# Patient Record
Sex: Male | Born: 1960
Health system: Southern US, Community
[De-identification: ages and names within clinical notes are randomized; demographics above are authoritative.]

## PROBLEM LIST (undated history)

## (undated) DIAGNOSIS — E785 Hyperlipidemia, unspecified: Secondary | ICD-10-CM

## (undated) DIAGNOSIS — I10 Essential (primary) hypertension: Secondary | ICD-10-CM

## (undated) HISTORY — DX: Hyperlipidemia, unspecified: E78.5

## (undated) HISTORY — DX: Essential (primary) hypertension: I10

---

## 2015-08-14 ENCOUNTER — Ambulatory Visit (INDEPENDENT_AMBULATORY_CARE_PROVIDER_SITE_OTHER): Payer: Self-pay | Admitting: Physician Assistant

## 2015-08-14 VITALS — BP 124/84 | HR 74 | Temp 97.8°F | Resp 16 | Ht 72.0 in | Wt 201.2 lb

## 2015-08-14 DIAGNOSIS — Z024 Encounter for examination for driving license: Secondary | ICD-10-CM

## 2015-08-14 DIAGNOSIS — I10 Essential (primary) hypertension: Secondary | ICD-10-CM

## 2015-08-14 DIAGNOSIS — Z021 Encounter for pre-employment examination: Secondary | ICD-10-CM

## 2015-08-14 NOTE — Progress Notes (Signed)
This patient presents for DOT examination for fitness for duty.  Medical History: yes  Any illness or injury in the last 5 years? - Blood pressure that is [facility] by [facility] no  Head/Brain Injuries, disorders or illnesses no  Seizures, epilepsy no  Eye disorders or impaired vision - wears glasses no  Ear disorders, loss of hearing or balance no  Heart disease or heart attack; other cardiovascular condition no  Heart surgery (valve replacement/bypass, angioplasty, pacemaker) yes  High blood pressure - on medication no  Muscular disease no  Shortness of breath no  Lung disease, emphysema, asthma, chronic bronchitis no  Kidney disease, dialysis no  Liver disease no  Digestive problems no  Diabetes or elevated blood sugar no  Nervious or psychiatric disorders, e.g., severe depression no  Loss of, or altered consciousness no  Fainting, dizziness no  Sleep disorders, pauses in breathing while asleep, daytime sleepiness, loud snoring no  Stroke or paralysis no  Missing or impaired hand, arm, foot, leg, finger, toe no  Spinal injury or disease no  Chronic low back pain no  Regular, frequent alcohol use no  Narcotic or habit forming drug use  Current Medications: Prior to Admission medications   Medication Sig Start Date End Date Taking? Authorizing Provider  lisinopril (PRINIVIL,ZESTRIL) 40 MG tablet Take 40 mg by mouth daily.   Yes Historical Provider, MD    Medical Examiner's Comments on Health History:  Healthy - on blood pressure medications  TESTING:   Visual Acuity Screening   Right eye Left eye Both eyes  Without correction:     With correction: 20/20 20/20 20/20   Comments: Colors:8/8 Titimus: 85 pass The patient was able to hear a forced whisper from L=10, R=10 feet.    Monocular Vision: No.  Hearing Aid used for test: No. Hearing Aid required to to meet standard: No.  BP 142/80 mmHg  Pulse 74  Temp(Src) 97.8 F (36.6 C) (Oral)  Resp 16   Ht 6' (1.829 m)  Wt 201 lb 3.2 oz (91.264 kg)  BMI 27.28 kg/m2  SpO2 98% Pulse rate is regular  Urine Specimen: Specific Gravity 1.010, Protein neg, Blood neg, Sugar neg  PHYSICAL EXAMINATION:  1. No. General Appearance: Marked overweight, tremor, signs of alcoholism, problem drinking or drug abuse. 2. No. Eyes: pupillary equality, reaction to light, accommodation, ocular motility, ocular muscle imbalance, extra ocular movement, nystagmus, exopthalmos. Ask about retinopathy, cataracts, aphakia, glaucoma, macular degeneration and refer to a specialist if appropriate.  3. No. Ears: Scarring of tympanic membrane, occlusion of external canal, perforated eardrums.     4. No. Mouth and Throat: Irremedial deformities likely to interfere with breathing or swallowing.    5. No. Heart: Murmurs, extra sounds, enlarged heart, pacemaker, implantable defibrillator.     6. No. Lungs and Chest, not including breast examination: Abnormal Chest wall expansion, abnormal respiratory rate, abnormal breath sounds including wheezes or alveolar rates, impaired respiratory function, cyanosis. Abnormal findings on physical exam may require further testing such as pulmonary tests and/or x ray of chest.  7. No. Abdomen and Viscera: Enlarged liver, enlarged spleen, masses, bruits, hernia, significant abdominal wall muscle weakness.  8. No. Vascular System: Abnormal pulse and amplitude, carotid or arterial bruits, varicose veins.    9. No. Genitourinary System: Hernia  10. No. Extremities-Limb impaired: Loss or impairment of leg, foot, toe, arm, hand, finger. Perceptible limp, deformities, atrophy, weakness, paralysis, clubbing, edema, hypotonia. Insufficient grasp and prehension to maintain steering wheel grip. Insufficient  mobility and strength in lower limb to operate pedals properly. 11. No. Spine, other musculoskeletal: Previous surgery, deformities, limitation of motion, tenderness.  12. No. Neurological: Impaired  equilibrium, coordination or speech pattern; paresthesia, asymmetric deep tendon reflexes, sensory or positional abnormalities, abnormal patellar and Babinski's reflexes, ataxia.     Comments: healthy - HTN well controlled on medication  Certification Status: does not meet standards for 2 year certificate. Does not meet standards. 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

## 2016-05-25 ENCOUNTER — Ambulatory Visit (INDEPENDENT_AMBULATORY_CARE_PROVIDER_SITE_OTHER): Payer: Self-pay | Admitting: Adult Health

## 2016-05-25 VITALS — BP 186/90 | HR 95 | Resp 20 | Ht 72.5 in | Wt 206.6 lb

## 2016-05-25 DIAGNOSIS — Z0000 Encounter for general adult medical examination without abnormal findings: Secondary | ICD-10-CM

## 2016-05-25 DIAGNOSIS — I10 Essential (primary) hypertension: Secondary | ICD-10-CM

## 2016-05-25 DIAGNOSIS — Z1211 Encounter for screening for malignant neoplasm of colon: Secondary | ICD-10-CM

## 2016-05-25 LAB — DIFFERENTIAL
BASOS PCT: 1 %
Basophils Absolute: 79 cells/uL (ref 0–200)
EOS PCT: 3 %
Eosinophils Absolute: 237 cells/uL (ref 15–500)
Lymphocytes Relative: 23 %
Lymphs Abs: 1817 cells/uL (ref 850–3900)
MONOS PCT: 10 %
Monocytes Absolute: 790 cells/uL (ref 200–950)
Neutro Abs: 4977 cells/uL (ref 1500–7800)
Neutrophils Relative %: 63 %

## 2016-05-25 LAB — CBC
HCT: 48.9 % (ref 38.5–50.0)
Hemoglobin: 16 g/dL (ref 13.2–17.1)
MCH: 29.1 pg (ref 27.0–33.0)
MCHC: 32.7 g/dL (ref 32.0–36.0)
MCV: 88.9 fL (ref 80.0–100.0)
MPV: 10.2 fL (ref 7.5–12.5)
PLATELETS: 227 10*3/uL (ref 140–400)
RBC: 5.5 MIL/uL (ref 4.20–5.80)
RDW: 14.5 % (ref 11.0–15.0)
WBC: 7.9 10*3/uL (ref 3.8–10.8)

## 2016-05-25 LAB — CBC WITH DIFFERENTIAL/PLATELET
BASOS ABS: 79 {cells}/uL (ref 0–200)
Basophils Relative: 1 %
EOS ABS: 237 {cells}/uL (ref 15–500)
Eosinophils Relative: 3 %
HCT: 48.9 % (ref 38.5–50.0)
Hemoglobin: 16 g/dL (ref 13.2–17.1)
Lymphocytes Relative: 23 %
Lymphs Abs: 1817 cells/uL (ref 850–3900)
MCH: 29.1 pg (ref 27.0–33.0)
MCHC: 32.7 g/dL (ref 32.0–36.0)
MCV: 88.9 fL (ref 80.0–100.0)
MONOS PCT: 10 %
MPV: 10.2 fL (ref 7.5–12.5)
Monocytes Absolute: 790 cells/uL (ref 200–950)
NEUTROS ABS: 4977 {cells}/uL (ref 1500–7800)
NEUTROS PCT: 63 %
PLATELETS: 227 10*3/uL (ref 140–400)
RBC: 5.5 MIL/uL (ref 4.20–5.80)
RDW: 14.5 % (ref 11.0–15.0)
WBC: 7.9 10*3/uL (ref 3.8–10.8)

## 2016-05-25 LAB — LIPID PANEL
CHOLESTEROL: 241 mg/dL — AB (ref <200)
HDL: 32 mg/dL — ABNORMAL LOW (ref >40)
LDL CALC: 140 mg/dL — AB (ref <100)
Total CHOL/HDL Ratio: 7.5 Ratio — ABNORMAL HIGH (ref <5.0)
Triglycerides: 347 mg/dL — ABNORMAL HIGH (ref <150)
VLDL: 69 mg/dL — AB (ref <30)

## 2016-05-25 LAB — HEPATIC FUNCTION PANEL
ALBUMIN: 4.3 g/dL (ref 3.6–5.1)
ALK PHOS: 54 U/L (ref 40–115)
ALT: 22 U/L (ref 9–46)
AST: 21 U/L (ref 10–35)
Bilirubin, Direct: 0.1 mg/dL (ref <=0.2)
Indirect Bilirubin: 0.3 mg/dL (ref 0.2–1.2)
TOTAL PROTEIN: 7.2 g/dL (ref 6.1–8.1)
Total Bilirubin: 0.4 mg/dL (ref 0.2–1.2)

## 2016-05-25 LAB — BASIC METABOLIC PANEL
BUN: 10 mg/dL (ref 7–25)
CHLORIDE: 103 mmol/L (ref 98–110)
CO2: 31 mmol/L (ref 20–31)
CREATININE: 1 mg/dL (ref 0.70–1.33)
Calcium: 9.6 mg/dL (ref 8.6–10.3)
GLUCOSE: 92 mg/dL (ref 65–99)
Potassium: 4.7 mmol/L (ref 3.5–5.3)
Sodium: 142 mmol/L (ref 135–146)

## 2016-05-25 LAB — PSA: PSA: 0.5 ng/mL (ref <=4.0)

## 2016-05-25 MED ORDER — LISINOPRIL 40 MG PO TABS: 40.0000 mg | ORAL_TABLET | Freq: Every day | ORAL | 0 refills | Status: DC

## 2016-05-25 NOTE — Patient Instructions (Signed)
It was a pleasure meeting you today!  We will follow up with you on your labs.   I have sent in a 90 day supply of lisinopril.   Someone will call you to schedule your appointment for a colonoscopy.   Please follow up with a primary care provider to manage your elevated blood pressure.   Health Maintenance, Male A healthy lifestyle and preventative care can promote health and wellness.  Maintain regular health, dental, and eye exams.  Eat a healthy diet. Foods like vegetables, fruits, whole grains, low-fat dairy products, and lean protein foods contain the nutrients you need and are low in calories. Decrease your intake of foods high in solid fats, added sugars, and salt. Get information about a proper diet from your health care provider, if necessary.  Regular physical exercise is one of the most important things you can do for your health. Most adults should get at least 150 minutes of moderate-intensity exercise (any activity that increases your heart rate and causes you to sweat) each week. In addition, most adults need muscle-strengthening exercises on 2 or more days a week.   Maintain a healthy weight. The body mass index (BMI) is a screening tool to identify possible weight problems. It provides an estimate of body fat based on height and weight. Your health care provider can find your BMI and can help you achieve or maintain a healthy weight. For males 20 years and older:  A BMI below 18.5 is considered underweight.  A BMI of 18.5 to 24.9 is normal.  A BMI of 25 to 29.9 is considered overweight.  A BMI of 30 and above is considered obese.  Maintain normal blood lipids and cholesterol by exercising and minimizing your intake of saturated fat. Eat a balanced diet with plenty of fruits and vegetables. Blood tests for lipids and cholesterol should begin at age 54 and be repeated every 5 years. If your lipid or cholesterol levels are high, you are over age 54, or you are at high risk  for heart disease, you may need your cholesterol levels checked more frequently.Ongoing high lipid and cholesterol levels should be treated with medicines if diet and exercise are not working.  If you smoke, find out from your health care provider how to quit. If you do not use tobacco, do not start.  Lung cancer screening is recommended for adults aged 55-80 years who are at high risk for developing lung cancer because of a history of smoking. A yearly low-dose CT scan of the lungs is recommended for people who have at least a 30-pack-year history of smoking and are current smokers or have quit within the past 15 years. A pack year of smoking is smoking an average of 1 pack of cigarettes a day for 1 year (for example, a 30-pack-year history of smoking could mean smoking 1 pack a day for 30 years or 2 packs a day for 15 years). Yearly screening should continue until the smoker has stopped smoking for at least 15 years. Yearly screening should be stopped for people who develop a health problem that would prevent them from having lung cancer treatment.  If you choose to drink alcohol, do not have more than 2 drinks per day. One drink is considered to be 12 oz (360 mL) of beer, 5 oz (150 mL) of wine, or 1.5 oz (45 mL) of liquor.  Avoid the use of street drugs. Do not share needles with anyone. Ask for help if you need support  or instructions about stopping the use of drugs.  High blood pressure causes heart disease and increases the risk of stroke. High blood pressure is more likely to develop in:  People who have blood pressure in the end of the normal range (100-139/85-89 mm Hg).  People who are overweight or obese.  People who are African American.  If you are 3-69 years of age, have your blood pressure checked every 3-5 years. If you are 41 years of age or older, have your blood pressure checked every year. You should have your blood pressure measured twice--once when you are at a hospital or  clinic, and once when you are not at a hospital or clinic. Record the average of the two measurements. To check your blood pressure when you are not at a hospital or clinic, you can use:  An automated blood pressure machine at a pharmacy.  A home blood pressure monitor.  If you are 70-57 years old, ask your health care provider if you should take aspirin to prevent heart disease.  Diabetes screening involves taking a blood sample to check your fasting blood sugar level. This should be done once every 3 years after age 25 if you are at a normal weight and without risk factors for diabetes. Testing should be considered at a younger age or be carried out more frequently if you are overweight and have at least 1 risk factor for diabetes.  Colorectal cancer can be detected and often prevented. Most routine colorectal cancer screening begins at the age of 47 and continues through age 60. However, your health care provider may recommend screening at an earlier age if you have risk factors for colon cancer. On a yearly basis, your health care provider may provide home test kits to check for hidden blood in the stool. A small camera at the end of a tube may be used to directly examine the colon (sigmoidoscopy or colonoscopy) to detect the earliest forms of colorectal cancer. Talk to your health care provider about this at age 29 when routine screening begins. A direct exam of the colon should be repeated every 5-10 years through age 33, unless early forms of precancerous polyps or small growths are found.  People who are at an increased risk for hepatitis B should be screened for this virus. You are considered at high risk for hepatitis B if:  You were born in a country where hepatitis B occurs often. Talk with your health care provider about which countries are considered high risk.  Your parents were born in a high-risk country and you have not received a shot to protect against hepatitis B (hepatitis B  vaccine).  You have HIV or AIDS.  You use needles to inject street drugs.  You live with, or have sex with, someone who has hepatitis B.  You are a man who has sex with other men (MSM).  You get hemodialysis treatment.  You take certain medicines for conditions like cancer, organ transplantation, and autoimmune conditions.  Hepatitis C blood testing is recommended for all people born from [birth year] through 1964 and any individual with known risk factors for hepatitis C.  Healthy men should no longer receive prostate-specific antigen (PSA) blood tests as part of routine cancer screening. Talk to your health care provider about prostate cancer screening.  Testicular cancer screening is not recommended for adolescents or adult males who have no symptoms. Screening includes self-exam, a health care provider exam, and other screening tests. Consult with your health  care provider about any symptoms you have or any concerns you have about testicular cancer.  Practice safe sex. Use condoms and avoid high-risk sexual practices to reduce the spread of sexually transmitted infections (STIs).  You should be screened for STIs, including gonorrhea and chlamydia if:  You are sexually active and are younger than 24 years.  You are older than 24 years, and your health care provider tells you that you are at risk for this type of infection.  Your sexual activity has changed since you were last screened, and you are at an increased risk for chlamydia or gonorrhea. Ask your health care provider if you are at risk.  If you are at risk of being infected with HIV, it is recommended that you take a prescription medicine daily to prevent HIV infection. This is called pre-exposure prophylaxis (PrEP). You are considered at risk if:  You are a man who has sex with other men (MSM).  You are a heterosexual man who is sexually active with multiple partners.  You take drugs by injection.  You are sexually active  with a partner who has HIV.  Talk with your health care provider about whether you are at high risk of being infected with HIV. If you choose to begin PrEP, you should first be tested for HIV. You should then be tested every 3 months for as long as you are taking PrEP.  Use sunscreen. Apply sunscreen liberally and repeatedly throughout the day. You should seek shade when your shadow is shorter than you. Protect yourself by wearing long sleeves, pants, a wide-brimmed hat, and sunglasses year round whenever you are outdoors.  Tell your health care provider of new moles or changes in moles, especially if there is a change in shape or color. Also, tell your health care provider if a mole is larger than the size of a pencil eraser.  A one-time screening for abdominal aortic aneurysm (AAA) and surgical repair of large AAAs by ultrasound is recommended for men aged 88-75 years who are current or former smokers.  Stay current with your vaccines (immunizations).   This information is not intended to replace advice given to you by your health care provider. Make sure you discuss any questions you have with your health care provider.   Document Released: 11/21/2007 Document Revised: 06/15/2014 Document Reviewed: 10/20/2010 Elsevier Interactive Patient Education [employer].

## 2016-05-25 NOTE — Progress Notes (Signed)
Subjective:    Patient ID: David Maddox, male    DOB: [birth year], 55 y.o.   MRN: [id number]  HPI  [facility] preventative medicine examination. He is a pleasant 55 year old male who  has a past medical history of Essential hypertension.  All immunizations and health maintenance protocols were reviewed with the patient and needed orders were placed. He refuses influenza or tdap   Appropriate screening laboratory values were ordered for the patient including screening of hyperlipidemia, renal function and hepatic function. If indicated by BPH, a PSA was ordered.  Medication reconciliation,  past medical history, social history, problem list and allergies were reviewed in detail with the patient  Goals were established with regard to weight loss, exercise, and  diet in compliance with medications.  He has a history of hypertension and reports being on Lisinopril 40 mg. 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 He reports having " a few pills" but that he has not taken his blood pressure medication in the last " few weeks."     Review of Systems  Constitutional: Negative.   HENT: Negative.   Eyes: Negative.   Respiratory: Negative.   Cardiovascular: Negative.   Gastrointestinal: Negative.   Endocrine: Negative.   Genitourinary: Negative.   Musculoskeletal: Negative.   Skin: Negative.   Allergic/Immunologic: Negative.   Neurological: Negative.   Hematological: Negative.   Psychiatric/Behavioral: Negative.   All other systems reviewed and are negative.  Past Medical History:  Diagnosis Date  . Essential hypertension     Social History   Social History  . Marital status: Single    Spouse name: N/A  . Number of children: N/A  . Years of education: N/A   Occupational History  . Not on file.   Social History Main Topics  . Smoking status: Never Smoker  . Smokeless tobacco: Not on  file  . Alcohol use Not on file  . Drug use: Unknown  . Sexual activity: Not on file   Other Topics Concern  . Not on file   Social History Narrative  . No narrative on file    No past surgical history on file.  No family history on file.  No Known Allergies  No current outpatient prescriptions on file prior to visit.   No current facility-administered medications on file prior to visit.     BP (!) 186/90   Pulse 95   Resp 20   Ht 6' 0.5" (1.842 m)   Wt 206 lb 9.6 oz (93.7 kg)   BMI 27.63 kg/m       Objective:   Physical Exam  Constitutional: He is oriented to person, place, and time. He appears well-developed and well-nourished. No distress.  Slightly obese   HENT:  Head: Normocephalic and atraumatic.  Right Ear: External ear normal.  Left Ear: External ear normal.  Nose: Nose normal.  Mouth/Throat: Oropharynx is clear and moist. No oropharyngeal exudate.  Eyes: Conjunctivae and EOM are normal. Pupils are equal, round, and reactive to light. Right eye exhibits no discharge. Left eye exhibits no discharge. No scleral icterus.  Neck: Normal range of motion. Neck supple. No JVD present. No tracheal deviation present. No thyromegaly present.  Cardiovascular: Normal rate, regular rhythm, normal heart sounds and intact distal pulses.  Exam reveals no gallop and no friction rub.   No murmur heard. Pulmonary/Chest: Effort normal and breath sounds normal. No stridor. No respiratory  distress. He has no wheezes. He has no rales. He exhibits no tenderness.  Abdominal: Soft. Bowel sounds are normal. He exhibits no distension and no mass. There is no tenderness. There is no rebound and no guarding.  Genitourinary: Prostate normal. Rectal exam shows guaiac positive stool.  Musculoskeletal: Normal range of motion. He exhibits no edema, tenderness or deformity.  Lymphadenopathy:    He has no cervical adenopathy.  Neurological: He is alert and oriented to person, place, and time.  He has normal reflexes. He displays normal reflexes. No cranial nerve deficit. He exhibits normal muscle tone. Coordination normal.  Skin: Skin is warm and dry. No rash noted. He is not diaphoretic. No erythema. No pallor.  Scattered moles noted.  Has surgical scar on back from previous surgery  Psychiatric: He has a normal mood and affect. His behavior is normal. Judgment and thought content normal.  Nursing note and vitals reviewed.      Assessment & Plan:  1. Routine general medical examination at a health care facility - Educated on the importance of diet and exercise - Take blood pressure medication daily.  - Monitor BP at home.  - Basic metabolic panel; Future - CBC with Differential/Platelet; Future - Hepatic function panel; Future - Hemoglobin A1c; Future - Lipid panel; Future - POCT Urinalysis Dipstick (Automated); Future - PSA; Future - TSH  2. Colon cancer screening - Ambulatory referral to Gastroenterology  3. Essential hypertension - Educated on the importance of a heart healthy diet and frequent exercise - He is to take his blood pressure medication when he gets home - lisinopril (PRINIVIL,ZESTRIL) 40 MG tablet; Take 1 tablet (40 mg total) by mouth daily.  Dispense: 90 tablet; Refill: 0 - Scheduled establish care visit with Dr. Swazil[place]Jordan to follow up with on hypertension.   Shirline Freesory Kaevon Cotta, NP

## 2016-05-25 NOTE — Addendum Note (Signed)
Addended by: Kern ReapVEREEN, Dene Landsberg B on: 05/25/2016 03:03 PM   Modules accepted: Orders

## 2016-05-26 LAB — HEMOGLOBIN A1C
Hgb A1c MFr Bld: 5.7 % — ABNORMAL HIGH (ref <5.7)
Mean Plasma Glucose: 117 mg/dL

## 2016-05-26 LAB — TSH: TSH: 1.02 m[IU]/L (ref 0.40–4.50)

## 2016-06-04 ENCOUNTER — Encounter: Payer: Self-pay | Admitting: *Deleted

## 2016-06-11 ENCOUNTER — Encounter: Payer: Self-pay | Admitting: Family Medicine

## 2016-06-11 ENCOUNTER — Ambulatory Visit (INDEPENDENT_AMBULATORY_CARE_PROVIDER_SITE_OTHER): Payer: Commercial Managed Care - PPO | Admitting: Family Medicine

## 2016-06-11 VITALS — BP 142/80 | HR 76 | Resp 12 | Ht 72.5 in | Wt 210.1 lb

## 2016-06-11 DIAGNOSIS — E8881 Metabolic syndrome: Secondary | ICD-10-CM | POA: Diagnosis not present

## 2016-06-11 DIAGNOSIS — E782 Mixed hyperlipidemia: Secondary | ICD-10-CM

## 2016-06-11 DIAGNOSIS — I10 Essential (primary) hypertension: Secondary | ICD-10-CM

## 2016-06-11 DIAGNOSIS — Z0000 Encounter for general adult medical examination without abnormal findings: Secondary | ICD-10-CM

## 2016-06-11 NOTE — Progress Notes (Signed)
Pre visit review using our clinic review tool, if applicable. No additional management support is needed unless otherwise documented below in the visit note. 

## 2016-06-11 NOTE — Progress Notes (Signed)
David Maddox is a 56 y.o.male, who is here today to establish care with me and to follow on HTN. Last CPE 4 years ago, he had a DOT physical a few months ago.  Dx with HTN years ago.  Currently he is on Lisinopril 40 mg daily.  He is taking medications as instructed, no side effects reported.  He has not noted unusual headache, visual changes, exertional chest pain, dyspnea,  focal weakness, or edema.    Lab Results  Component Value Date   CREATININE 1.00 05/25/2016   BUN 10 05/25/2016   NA 142 05/25/2016   K 4.7 05/25/2016   CL 103 05/25/2016   CO2 31 05/25/2016    He is not exercise regularly, has not been consistent with a healthy diet but he is following low salt diet.  He is not checking BP at home.   Hyperlipidemia:   He has not been consistent with low fat diet.  Last lipid panel was not fasting.  Lab Results  Component Value Date   CHOL 241 (H) 05/25/2016   HDL 32 (L) 05/25/2016   LDLCALC 140 (H) 05/25/2016   TRIG 347 (H) 05/25/2016   CHOLHDL 7.5 (H) 05/25/2016      Review of Systems  Constitutional: Negative for activity change, appetite change, fatigue, fever and unexpected weight change.  HENT: Negative for nosebleeds, sore throat and trouble swallowing.   Eyes: Negative for redness and visual disturbance.  Respiratory: Negative for apnea, cough, shortness of breath and wheezing.   Cardiovascular: Negative for chest pain, palpitations and leg swelling.  Gastrointestinal: Negative for abdominal pain, nausea and vomiting.       No changes in bowel habits.  Genitourinary: Negative for decreased urine volume, difficulty urinating and hematuria.  Skin: Negative for rash.  Neurological: Negative for syncope, weakness and headaches.  Psychiatric/Behavioral: Negative for confusion. The patient is not nervous/anxious.      No current outpatient prescriptions on file prior to visit.   No current facility-administered medications on  file prior to visit.      Past Medical History:  Diagnosis Date  . Essential hypertension   . Hyperlipidemia     No Known Allergies  Social History   Social History  . Marital status: Single    Spouse name: N/A  . Number of children: N/A  . Years of education: N/A   Social History Main Topics  . Smoking status: Never Smoker  . Smokeless tobacco: Never Used  . Alcohol use None  . Drug use: No  . Sexual activity: Not Asked   Other Topics Concern  . None   Social History Narrative  . None    Vitals:   06/11/16 1455  BP: (!) 142/80  Pulse: 76  Resp: 12   Body mass index is 28.11 kg/m.    Physical Exam  Nursing note and vitals reviewed. Constitutional: He is oriented to person, place, and time. He appears well-developed. No distress.  HENT:  Head: Atraumatic.  Mouth/Throat: Oropharynx is clear and moist and mucous membranes are normal.  Eyes: Conjunctivae and EOM are normal. Pupils are equal, round, and reactive to light.  Neck: No thyroid mass and no thyromegaly present.  Cardiovascular: Normal rate and regular rhythm.   No murmur heard. Pulses:      Posterior tibial pulses are 2+ on the right side, and 2+ on the left side.  Respiratory: Effort normal and breath sounds normal. No respiratory distress.  GI: Soft. He exhibits  no mass. There is no hepatomegaly. There is no tenderness.  Musculoskeletal: He exhibits edema (1+ pitting LE edema bilateral).  Neurological: He is alert and oriented to person, place, and time. He has normal strength.  Skin: Skin is warm. No erythema.  Psychiatric: He has a normal mood and affect. Cognition and memory are normal.  Well groomed, good eye contact.    ASSESSMENT AND PLAN:   [name] was seen today for establish care.  Diagnoses and all orders for this visit:   Essential hypertension   SBP mildly elevated. No changes in current management for now, recommended monitoring BP at home. DASH-low salt  diet  recommended. Periodic eye exam recommended, at least every 1-2 years.  F/U in 4 months, before if needed.  -     lisinopril (PRINIVIL,ZESTRIL) 40 MG tablet; Take 1 tablet (40 mg total) by mouth daily.  Mixed hyperlipidemia  Low fat diet discussed, continue non pharmacologic treatment for now. F/U in 4 months.  Metabolic syndrome  TG 347, HgA1C 5.7, abdominal obesity and BMI > 25, HDL 32. We discussed risk for CVD and prevention through a healthy life style.  Healthcare maintenance  He has not had colonoscopy, for now he is not interested because work schedule.    -David Maddox was advised to return sooner than planned today if new concerns arise.     Iman Reinertsen G. [place], MD  [facility]. [place] office.

## 2016-06-11 NOTE — Patient Instructions (Addendum)
A few things to remember from today's visit:   Benign essential HTN  Mixed hyperlipidemia  Metabolic syndrome    Blood pressure goal for most people is less than 140/90.  Elevated blood pressure increases the risk of strokes, heart and kidney disease, and eye problems.  Regular physical activity and a healthy diet (DASH diet) usually help. Low salt diet. Take medications as instructed. Caution with some over the counter medications as cold medications, dietary products (for weight loss), and Ibuprofen or Aleve (frequent use);all these medications could cause elevation of blood pressure.    Please be sure medication list is accurate. If a new problem present, please set up appointment sooner than planned today.

## 2016-06-14 MED ORDER — LISINOPRIL 40 MG PO TABS: 40.0000 mg | ORAL_TABLET | Freq: Every day | ORAL | 0 refills | Status: DC

## 2016-08-05 ENCOUNTER — Encounter: Payer: Self-pay | Admitting: Adult Health

## 2016-10-01 DIAGNOSIS — R233 Spontaneous ecchymoses: Secondary | ICD-10-CM | POA: Diagnosis not present

## 2016-10-14 NOTE — Progress Notes (Deleted)
     HPI:  [name] is a 56 y.o.male here today for his routine physical examination.  He lives with ***  Regular exercise 3 or more times per week: *** Following a healthy diet: ***   Chronic medical problems: ***  Hx of STD's: ***   There is no immunization history on file for this patient.    -Hep C screening (if born [id number]): ***   Last colon cancer screening: *** Last prostate ca screening: ***  -Denies high alcohol intake, tobacco use, or Hx of illicit drug use.  -Concerns and/or follow up today: ***   HTN:  He is currently on Lisinopril 40 mg daily. Home BP's: ***  Denies severe/frequent headache, visual changes, chest pain, dyspnea, palpitation, claudication, focal weakness, or edema.  Lab Results  Component Value Date   CREATININE 1.00 05/25/2016   BUN 10 05/25/2016   NA 142 05/25/2016   K 4.7 05/25/2016   CL 103 05/25/2016   CO2 31 05/25/2016     Hyperlipidemia:  Currently on ***   Lab Results  Component Value Date   CHOL 241 (H) 05/25/2016   HDL 32 (L) 05/25/2016   LDLCALC 140 (H) 05/25/2016   TRIG 347 (H) 05/25/2016   CHOLHDL 7.5 (H) 05/25/2016    IFG:  Lab Results  Component Value Date   HGBA1C 5.7 (H) 05/25/2016   He is *** following a healthy diet consistently. He is *** exercising regularly.   Review of Systems   Current Outpatient Prescriptions on File Prior to Visit  Medication Sig Dispense Refill  . lisinopril (PRINIVIL,ZESTRIL) 40 MG tablet Take 1 tablet (40 mg total) by mouth daily. 90 tablet 0   No current facility-administered medications on file prior to visit.      Past Medical History:  Diagnosis Date  . Essential hypertension   . Hyperlipidemia     No Known Allergies  Family History  Problem Relation Age of Onset  . Hyperlipidemia Mother   . Diabetes Mother   . Hypertension Mother   . Hypertension Father   . Hyperlipidemia Father   . Diabetes Brother   . Diabetes  Maternal Grandmother   . Diabetes Maternal Grandfather     Social History   Social History  . Marital status: Single    Spouse name: N/A  . Number of children: N/A  . Years of education: N/A   Social History Main Topics  . Smoking status: Never Smoker  . Smokeless tobacco: Never Used  . Alcohol use Not on file  . Drug use: No  . Sexual activity: Not on file   Other Topics Concern  . Not on file   Social History Narrative  . No narrative on file     There were no vitals filed for this visit. There is no height or weight on file to calculate BMI.  @LASTSAO2 (3)@  Wt Readings from Last 3 Encounters:  06/11/16 210 lb 2 oz (95.3 kg)  05/25/16 206 lb 9.6 oz (93.7 kg)  08/14/15 201 lb 3.2 oz (91.3 kg)        Physical Exam      ASSESSMENT AND PLAN:   Discussed the following assessment and plan:   There are no diagnoses linked to this encounter.       No Follow-up on file.    Betty G. SwazilandJordan, MD  [facility]. [place] office.

## 2016-10-15 ENCOUNTER — Encounter: Payer: Commercial Managed Care - PPO | Admitting: Family Medicine

## 2017-04-17 DIAGNOSIS — I10 Essential (primary) hypertension: Secondary | ICD-10-CM | POA: Diagnosis not present

## 2017-04-17 DIAGNOSIS — L03119 Cellulitis of unspecified part of limb: Secondary | ICD-10-CM | POA: Diagnosis not present

## 2017-08-19 DIAGNOSIS — J111 Influenza due to unidentified influenza virus with other respiratory manifestations: Secondary | ICD-10-CM | POA: Diagnosis not present

## 2018-08-14 ENCOUNTER — Encounter (HOSPITAL_COMMUNITY): Payer: Self-pay | Admitting: *Deleted

## 2018-08-14 ENCOUNTER — Ambulatory Visit (HOSPITAL_COMMUNITY)
Admission: EM | Admit: 2018-08-14 | Discharge: 2018-08-14 | Disposition: A | Discharge status: Home / Self Care | Payer: Commercial Managed Care - PPO | Attending: Family Medicine | Admitting: Family Medicine

## 2018-08-14 ENCOUNTER — Other Ambulatory Visit: Payer: Self-pay

## 2018-08-14 DIAGNOSIS — I10 Essential (primary) hypertension: Secondary | ICD-10-CM

## 2018-08-14 DIAGNOSIS — R42 Dizziness and giddiness: Secondary | ICD-10-CM | POA: Diagnosis not present

## 2018-08-14 DIAGNOSIS — R202 Paresthesia of skin: Secondary | ICD-10-CM

## 2018-08-14 MED ORDER — AMLODIPINE BESYLATE 5 MG PO TABS: 5.0000 mg | ORAL_TABLET | Freq: Every day | ORAL | 5 refills | Status: DC

## 2018-08-14 NOTE — Discharge Instructions (Addendum)
The dizziness may be related to the lisinopril.  In any event I think your better off taking the amlodipine because it has fewer side effects potentially.  The EKG we did today shows no damage to the heart or abnormality.  Your exam is normal as well.  At this point it be important to drink plenty of fluids as you are doing.  I think your weight loss over the last several months has been helpful for controlling her blood pressure.`

## 2018-08-14 NOTE — ED Provider Notes (Signed)
[facility]    CSN: [id number] Arrival date & time: 08/14/18  1506     History   Chief Complaint Chief Complaint  Patient presents with  . Chest Pain    HPI David Maddox is a 58 y.o. male.   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  He does not smoke and does not drink heavily.  He has no significant family history of heart disease although his father had a stroke.  He presents with 1 hour of dizziness which is mild and some tingling in his extremities.  He has had no chest pain.  He has no shortness of breath or leg pain.  He has had no nausea or vomiting.  He recently started taking some old lisinopril.     Past Medical History:  Diagnosis Date  . Essential hypertension   . Hyperlipidemia     Patient Active Problem List   Diagnosis Date Noted  . Mixed hyperlipidemia 06/11/2016  . Essential hypertension 08/14/2015    History reviewed. No pertinent surgical history.     Home Medications    Prior to Admission medications   Medication Sig Start Date End Date Taking? Authorizing Provider  amLODipine (NORVASC) 5 MG tablet Take 1 tablet (5 mg total) by mouth daily. 08/14/18   Elvina Sidle, MD    Family History Family History  Problem Relation Age of Onset  . Hyperlipidemia Mother   . Diabetes Mother   . Hypertension Mother   . Stroke Mother   . Hypertension Father   . Hyperlipidemia Father   . Stroke Father   . Diabetes Brother   . Diabetes Maternal Grandmother   . Diabetes Maternal Grandfather     Social History Social History   Tobacco Use  . Smoking status: Never Smoker  . Smokeless tobacco: Never Used  Substance Use Topics  . Alcohol use: Not on file  . Drug use: No     Allergies   Patient has no known allergies.   Review of Systems Review of Systems   Physical Exam Triage Vital Signs ED Triage Vitals  Enc Vitals Group   BP 08/14/18 1509 (!) 162/96     Pulse Rate 08/14/18 1509 88     Resp 08/14/18 1509 16     Temp --      Temp src --      SpO2 08/14/18 1510 100 %     Weight --      Height --      Head Circumference --      Peak Flow --      Pain Score 08/14/18 1513 0     Pain Loc --      Pain Edu? --      Excl. in GC? --    No data found.  Updated Vital Signs BP (!) 162/96   Pulse 88   Resp 16   SpO2 100%    Physical Exam Vitals signs and nursing note reviewed.  Constitutional:      General: He is not in acute distress.    Appearance: He is well-developed and normal weight. He is not ill-appearing or diaphoretic.  Eyes:     Extraocular Movements: Extraocular movements intact.     Pupils: Pupils are equal, round, and reactive to light.  Neck:     Musculoskeletal: Normal range of motion and neck supple.  Cardiovascular:     Rate  and Rhythm: Normal rate and regular rhythm.     Heart sounds: Normal heart sounds.  Pulmonary:     Effort: Pulmonary effort is normal.     Breath sounds: Normal breath sounds.  Abdominal:     General: Bowel sounds are normal.     Palpations: Abdomen is soft.  Musculoskeletal: Normal range of motion.  Skin:    General: Skin is warm and dry.  Neurological:     General: No focal deficit present.     Mental Status: He is alert and oriented to person, place, and time.  Psychiatric:        Mood and Affect: Mood normal.      UC Treatments / Results  Labs (all labs ordered are listed, but only abnormal results are displayed) Labs Reviewed - No data to display  EKG Twelve-lead EKG shows no acute changes.  Radiology No results found.  Procedures Procedures (including critical care time)  Medications Ordered in UC Medications - No data to display  Initial Impression / Assessment and Plan / UC Course  I have reviewed the triage vital signs and the nursing notes.  Pertinent labs & imaging results that were available during my care of the patient  were reviewed by me and considered in my medical decision making (see chart for details).    Final Clinical Impressions(s) / UC Diagnoses   Final diagnoses:  Dizziness and giddiness  Essential hypertension  Paresthesia     Discharge Instructions     The dizziness may be related to the lisinopril.  In any event I think your better off taking the amlodipine because it has fewer side effects potentially.  The EKG we did today shows no damage to the heart or abnormality.  Your exam is normal as well.  At this point it be important to drink plenty of fluids as you are doing.  I think your weight loss over the last several months has been helpful for controlling her blood pressure.`    ED Prescriptions    Medication Sig Dispense Auth. Provider   amLODipine (NORVASC) 5 MG tablet Take 1 tablet (5 mg total) by mouth daily. 30 tablet Elvina Sidle, MD     Controlled Substance Prescriptions [place] Controlled Substance Registry consulted? Not Applicable   Elvina Sidle, MD 08/14/18 1525

## 2018-08-14 NOTE — ED Triage Notes (Signed)
C/O left chest and LUE arm "tingling" x 1 hr with dizziness.  Skin W/D/P.  Denies nausea.  Reports BP 200s/100s last night.

## 2019-09-21 ENCOUNTER — Observation Stay (HOSPITAL_COMMUNITY)
Admission: EM | Admit: 2019-09-21 | Discharge: 2019-09-22 | Discharge status: Against Medical Advice | Payer: Commercial Managed Care - PPO | Attending: Internal Medicine | Admitting: Internal Medicine

## 2019-09-21 ENCOUNTER — Other Ambulatory Visit: Payer: Self-pay

## 2019-09-21 ENCOUNTER — Emergency Department (HOSPITAL_COMMUNITY): Payer: Commercial Managed Care - PPO

## 2019-09-21 ENCOUNTER — Encounter (HOSPITAL_COMMUNITY): Payer: Self-pay

## 2019-09-21 DIAGNOSIS — I10 Essential (primary) hypertension: Secondary | ICD-10-CM | POA: Insufficient documentation

## 2019-09-21 DIAGNOSIS — I6782 Cerebral ischemia: Secondary | ICD-10-CM | POA: Diagnosis not present

## 2019-09-21 DIAGNOSIS — E782 Mixed hyperlipidemia: Secondary | ICD-10-CM | POA: Insufficient documentation

## 2019-09-21 DIAGNOSIS — H5461 Unqualified visual loss, right eye, normal vision left eye: Secondary | ICD-10-CM | POA: Insufficient documentation

## 2019-09-21 DIAGNOSIS — Z5329 Procedure and treatment not carried out because of patient's decision for other reasons: Secondary | ICD-10-CM | POA: Insufficient documentation

## 2019-09-21 DIAGNOSIS — I161 Hypertensive emergency: Principal | ICD-10-CM | POA: Insufficient documentation

## 2019-09-21 DIAGNOSIS — Z8249 Family history of ischemic heart disease and other diseases of the circulatory system: Secondary | ICD-10-CM | POA: Diagnosis not present

## 2019-09-21 DIAGNOSIS — J341 Cyst and mucocele of nose and nasal sinus: Secondary | ICD-10-CM | POA: Insufficient documentation

## 2019-09-21 DIAGNOSIS — F22 Delusional disorders: Secondary | ICD-10-CM | POA: Insufficient documentation

## 2019-09-21 DIAGNOSIS — H53121 Transient visual loss, right eye: Secondary | ICD-10-CM | POA: Diagnosis present

## 2019-09-21 LAB — CBC
HCT: 48.1 % (ref 39.0–52.0)
Hemoglobin: 15.5 g/dL (ref 13.0–17.0)
MCH: 28.5 pg (ref 26.0–34.0)
MCHC: 32.2 g/dL (ref 30.0–36.0)
MCV: 88.4 fL (ref 80.0–100.0)
Platelets: 229 10*3/uL (ref 150–400)
RBC: 5.44 MIL/uL (ref 4.22–5.81)
RDW: 16.6 % — ABNORMAL HIGH (ref 11.5–15.5)
WBC: 6.2 10*3/uL (ref 4.0–10.5)
nRBC: 0 % (ref 0.0–0.2)

## 2019-09-21 LAB — DIFFERENTIAL
Abs Immature Granulocytes: 0.02 10*3/uL (ref 0.00–0.07)
Basophils Absolute: 0.1 10*3/uL (ref 0.0–0.1)
Basophils Relative: 1 %
Eosinophils Absolute: 0.1 10*3/uL (ref 0.0–0.5)
Eosinophils Relative: 2 %
Immature Granulocytes: 0 %
Lymphocytes Relative: 25 %
Lymphs Abs: 1.5 10*3/uL (ref 0.7–4.0)
Monocytes Absolute: 0.5 10*3/uL (ref 0.1–1.0)
Monocytes Relative: 8 %
Neutro Abs: 4 10*3/uL (ref 1.7–7.7)
Neutrophils Relative %: 64 %

## 2019-09-21 LAB — I-STAT CHEM 8, ED
BUN: 13 mg/dL (ref 6–20)
Calcium, Ion: 1.18 mmol/L (ref 1.15–1.40)
Chloride: 101 mmol/L (ref 98–111)
Creatinine, Ser: 1.1 mg/dL (ref 0.61–1.24)
Glucose, Bld: 125 mg/dL — ABNORMAL HIGH (ref 70–99)
HCT: 48 % (ref 39.0–52.0)
Hemoglobin: 16.3 g/dL (ref 13.0–17.0)
Potassium: 4 mmol/L (ref 3.5–5.1)
Sodium: 139 mmol/L (ref 135–145)
TCO2: 27 mmol/L (ref 22–32)

## 2019-09-21 LAB — COMPREHENSIVE METABOLIC PANEL
ALT: 15 U/L (ref 0–44)
AST: 21 U/L (ref 15–41)
Albumin: 4.3 g/dL (ref 3.5–5.0)
Alkaline Phosphatase: 58 U/L (ref 38–126)
Anion gap: 7 (ref 5–15)
BUN: 11 mg/dL (ref 6–20)
CO2: 29 mmol/L (ref 22–32)
Calcium: 9.1 mg/dL (ref 8.9–10.3)
Chloride: 101 mmol/L (ref 98–111)
Creatinine, Ser: 1.01 mg/dL (ref 0.61–1.24)
GFR calc Af Amer: >60 mL/min (ref >60)
GFR calc non Af Amer: >60 mL/min (ref >60)
Glucose, Bld: 132 mg/dL — ABNORMAL HIGH (ref 70–99)
Potassium: 4 mmol/L (ref 3.5–5.1)
Sodium: 137 mmol/L (ref 135–145)
Total Bilirubin: 1.3 mg/dL — ABNORMAL HIGH (ref 0.3–1.2)
Total Protein: 7 g/dL (ref 6.5–8.1)

## 2019-09-21 LAB — PROTIME-INR
INR: 1 (ref 0.8–1.2)
Prothrombin Time: 12.7 seconds (ref 11.4–15.2)

## 2019-09-21 LAB — APTT: aPTT: 29 seconds (ref 24–36)

## 2019-09-21 MED ORDER — IOHEXOL 350 MG/ML SOLN
75.0000 mL | Freq: Once | INTRAVENOUS | Status: AC | PRN
  Administered 2019-09-21: 23:00:00 75 mL via INTRAVENOUS

## 2019-09-21 MED ORDER — HYDRALAZINE HCL 20 MG/ML IJ SOLN
5.0000 mg | Freq: Once | INTRAMUSCULAR | Status: AC
  Administered 2019-09-21: 5 mg via INTRAVENOUS
  Filled 2019-09-21: qty 1

## 2019-09-21 MED ORDER — SODIUM CHLORIDE 0.9% FLUSH: 3.0000 mL | Freq: Once | INTRAVENOUS | Status: DC

## 2019-09-21 NOTE — ED Triage Notes (Addendum)
Pt arrives to ED w/ c/o vision changes in R eye that started 3 days ago. Pt otherwise neuro intact, AOx4. Denies headache.

## 2019-09-21 NOTE — ED Provider Notes (Signed)
Received patient at signout from [facility].  Refer to provider note for full history and physical examination.  Briefly patient is a 59 year old male with history of hypertension, hyperlipidemia presenting for evaluation of persistent right eye vision changes for 3 days.  Reports vision was "dark", denies diplopia or blurred vision.  He has been noncompliant with his antihypertensives.  He is pending MRI and troponin.  If MRI shows acute CVA or troponin is elevated then will require admission for hypertensive emergency.   Physical Exam  BP (!) 169/104   Pulse 76   Temp 98.4 F (36.9 C)   Resp 12   Ht 6' (1.829 m)   SpO2 97%   BMI 28.50 kg/m   Physical Exam Vitals and nursing note reviewed.  Constitutional:      General: He is not in acute distress.    Appearance: He is well-developed.  HENT:     Head: Normocephalic and atraumatic.  Eyes:     General:        Right eye: No discharge.        Left eye: No discharge.     Extraocular Movements: Extraocular movements intact.     Conjunctiva/sclera: Conjunctivae normal.     Pupils: Pupils are equal, round, and reactive to light.     Comments: No chemosis, proptosis, or consensual photophobia.   Neck:     Vascular: No JVD.     Trachea: No tracheal deviation.  Cardiovascular:     Rate and Rhythm: Normal rate.  Pulmonary:     Effort: Pulmonary effort is normal.  Abdominal:     General: There is no distension.  Musculoskeletal:     Cervical back: Neck supple.  Skin:    General: Skin is warm and dry.     Findings: No erythema.  Neurological:     Mental Status: He is alert.  Psychiatric:        Behavior: Behavior normal.     ED Course/Procedures   Clinical Course as of Sep 20 2352  Thu Sep 20, 2072  1928 59 year old male here with 3 days of diminished vision in his right eye.  No pain.  Also has not taken his blood pressure in over a month and has elevated blood pressures here.  Initial imaging CT head noncontrast was  unremarkable.  Labs show normal renal function.  Blood pressure is come down somewhat on its own.  Likely needs some further imaging and possibly referral to ophthalmology.   [MB]    Clinical Course User Index [MB] Hayden Rasmussen, MD    .Critical Care Performed by: Renita Papa, PA-C Authorized by: Renita Papa, PA-C   Critical care provider statement:    Critical care time (minutes):  45   Critical care was necessary to treat or prevent imminent or life-threatening deterioration of the following conditions:  Cardiac failure (hypertensive emergency)   Critical care was time spent personally by me on the following activities:  Discussions with consultants, evaluation of patient's response to treatment, examination of patient, ordering and performing treatments and interventions, ordering and review of laboratory studies, ordering and review of radiographic studies, pulse oximetry, re-evaluation of patient's condition, obtaining history from patient or surrogate and review of old charts    MDM   Labs Reviewed  CBC - Abnormal; Notable for the following components:      Result Value   RDW 16.6 (*)    All other components within normal limits  COMPREHENSIVE METABOLIC PANEL -  Abnormal; Notable for the following components:   Glucose, Bld 132 (*)    Total Bilirubin 1.3 (*)    All other components within normal limits  URINALYSIS, ROUTINE W REFLEX MICROSCOPIC - Abnormal; Notable for the following components:   Specific Gravity, Urine >1.046 (*)    All other components within normal limits  I-STAT CHEM 8, ED - Abnormal; Notable for the following components:   Glucose, Bld 125 (*)    All other components within normal limits  SARS CORONAVIRUS 2 (TAT 6-24 HRS)  PROTIME-INR  APTT  DIFFERENTIAL  LIPID PANEL  HIV ANTIBODY (ROUTINE TESTING W REFLEX)  CBC  CBG MONITORING, ED  TROPONIN I (HIGH SENSITIVITY)  TROPONIN I (HIGH SENSITIVITY)  TROPONIN I (HIGH SENSITIVITY)  TROPONIN I  (HIGH SENSITIVITY)   CT Angio Head W or Wo Contrast  Result Date: 09/21/2019 CLINICAL DATA:  Right eye vision changes EXAM: CT ANGIOGRAPHY HEAD AND NECK TECHNIQUE: Multidetector CT imaging of the head and neck was performed using the standard protocol during bolus administration of intravenous contrast. Multiplanar CT image reconstructions and MIPs were obtained to evaluate the vascular anatomy. Carotid stenosis measurements (when applicable) are obtained utilizing NASCET criteria, using the distal internal carotid diameter as the denominator. CONTRAST:  27mL OMNIPAQUE IOHEXOL 350 MG/ML SOLN COMPARISON:  None. FINDINGS: CTA NECK FINDINGS SKELETON: There is no bony spinal canal stenosis. No lytic or blastic lesion. OTHER NECK: Normal pharynx, larynx and major salivary glands. No cervical lymphadenopathy. Unremarkable thyroid gland. UPPER CHEST: No pneumothorax or pleural effusion. No nodules or masses. AORTIC ARCH: There is mild calcific atherosclerosis of the aortic arch. There is no aneurysm, dissection or hemodynamically significant stenosis of the visualized portion of the aorta. Conventional 3 vessel aortic branching pattern. The visualized proximal subclavian arteries are widely patent. RIGHT CAROTID SYSTEM: Normal without aneurysm, dissection or stenosis. LEFT CAROTID SYSTEM: Normal without aneurysm, dissection or stenosis. VERTEBRAL ARTERIES: Left dominant configuration. Both origins are clearly patent. There is no dissection, occlusion or flow-limiting stenosis to the skull base (V1-V3 segments). CTA HEAD FINDINGS POSTERIOR CIRCULATION: --Vertebral arteries: Normal V4 segments. --Posterior inferior cerebellar arteries (PICA): Patent origins from the vertebral arteries. --Anterior inferior cerebellar arteries (AICA): Patent origins from the basilar artery. --Basilar artery: Normal. --Superior cerebellar arteries: Normal. --Posterior cerebral arteries: Normal. Both originate from the basilar artery.  Posterior communicating arteries (p-comm) are diminutive or absent. ANTERIOR CIRCULATION: --Intracranial internal carotid arteries: Normal. --Anterior cerebral arteries (ACA): Normal. Both A1 segments are present. Patent anterior communicating artery (a-comm). --Middle cerebral arteries (MCA): Normal. VENOUS SINUSES: As permitted by contrast timing, patent. ANATOMIC VARIANTS: None Review of the MIP images confirms the above findings. IMPRESSION: Normal CTA of the head and neck. Electronically Signed   By: Deatra Robinson M.D.   On: 09/21/2019 23:03   CT HEAD WO CONTRAST  Result Date: 09/21/2019 CLINICAL DATA:  Neuro deficit, subacute, possible stroke. Additional history provided: Vision changes in right eye which began 3 days ago, blindness in right eye, headache, dizziness EXAM: CT HEAD WITHOUT CONTRAST TECHNIQUE: Contiguous axial images were obtained from the base of the skull through the vertex without intravenous contrast. COMPARISON:  No pertinent prior studies available for comparison. FINDINGS: Brain: There is no evidence of acute intracranial hemorrhage, intracranial mass, midline shift or extra-axial fluid collection.No demarcated cortical infarction. Minimal ill-defined hypoattenuation within the cerebral white matter is nonspecific, but consistent with chronic small vessel ischemic disease. Vascular: No hyperdense vessel. Skull: Normal. Negative for fracture or focal lesion. Sinuses/Orbits: Visualized orbits  demonstrate no acute abnormality. Small left maxillary sinus mucous retention cyst. Mild ethmoid, sphenoid and right maxillary sinus mucosal thickening. No significant mastoid effusion. IMPRESSION: No evidence of acute intracranial abnormality. Minimal chronic small vessel ischemic changes within the cerebral white matter. Mild paranasal sinus mucosal thickening. Left maxillary sinus mucous retention cyst. Electronically Signed   By: Jackey Loge DO   On: 09/21/2019 16:44   CT Angio Neck W and/or  Wo Contrast  Result Date: 09/21/2019 CLINICAL DATA:  Right eye vision changes EXAM: CT ANGIOGRAPHY HEAD AND NECK TECHNIQUE: Multidetector CT imaging of the head and neck was performed using the standard protocol during bolus administration of intravenous contrast. Multiplanar CT image reconstructions and MIPs were obtained to evaluate the vascular anatomy. Carotid stenosis measurements (when applicable) are obtained utilizing NASCET criteria, using the distal internal carotid diameter as the denominator. CONTRAST:  14mL OMNIPAQUE IOHEXOL 350 MG/ML SOLN COMPARISON:  None. FINDINGS: CTA NECK FINDINGS SKELETON: There is no bony spinal canal stenosis. No lytic or blastic lesion. OTHER NECK: Normal pharynx, larynx and major salivary glands. No cervical lymphadenopathy. Unremarkable thyroid gland. UPPER CHEST: No pneumothorax or pleural effusion. No nodules or masses. AORTIC ARCH: There is mild calcific atherosclerosis of the aortic arch. There is no aneurysm, dissection or hemodynamically significant stenosis of the visualized portion of the aorta. Conventional 3 vessel aortic branching pattern. The visualized proximal subclavian arteries are widely patent. RIGHT CAROTID SYSTEM: Normal without aneurysm, dissection or stenosis. LEFT CAROTID SYSTEM: Normal without aneurysm, dissection or stenosis. VERTEBRAL ARTERIES: Left dominant configuration. Both origins are clearly patent. There is no dissection, occlusion or flow-limiting stenosis to the skull base (V1-V3 segments). CTA HEAD FINDINGS POSTERIOR CIRCULATION: --Vertebral arteries: Normal V4 segments. --Posterior inferior cerebellar arteries (PICA): Patent origins from the vertebral arteries. --Anterior inferior cerebellar arteries (AICA): Patent origins from the basilar artery. --Basilar artery: Normal. --Superior cerebellar arteries: Normal. --Posterior cerebral arteries: Normal. Both originate from the basilar artery. Posterior communicating arteries (p-comm) are  diminutive or absent. ANTERIOR CIRCULATION: --Intracranial internal carotid arteries: Normal. --Anterior cerebral arteries (ACA): Normal. Both A1 segments are present. Patent anterior communicating artery (a-comm). --Middle cerebral arteries (MCA): Normal. VENOUS SINUSES: As permitted by contrast timing, patent. ANATOMIC VARIANTS: None Review of the MIP images confirms the above findings. IMPRESSION: Normal CTA of the head and neck. Electronically Signed   By: Deatra Robinson M.D.   On: 09/21/2019 23:03   MR BRAIN WO CONTRAST  Result Date: 09/22/2019 CLINICAL DATA:  Right eye vision changes EXAM: MRI HEAD WITHOUT CONTRAST TECHNIQUE: Multiplanar, multiecho pulse sequences of the brain and surrounding structures were obtained without intravenous contrast. COMPARISON:  None. FINDINGS: BRAIN: No acute infarct, acute hemorrhage or extra-axial collection. Multifocal white matter hyperintensity, most commonly due to chronic ischemic microangiopathy. Normal volume of brain parenchyma and CSF spaces. Midline structures are normal. VASCULAR: Major flow voids are preserved. Susceptibility-sensitive sequences show no chronic microhemorrhage or superficial siderosis. SKULL AND UPPER CERVICAL SPINE: Normal calvarium and skull base. Visualized upper cervical spine and soft tissues are normal. SINUSES/ORBITS: No paranasal sinus fluid levels or advanced mucosal thickening. No mastoid or middle ear effusion. Normal orbits. IMPRESSION: 1. No acute intracranial abnormality. 2. Mild nonspecific white matter hyperintensity, most commonly indicating chronic small vessel ischemia. Electronically Signed   By: Deatra Robinson M.D.   On: 09/22/2019 00:14   MRI is negative for acute CVA.  Serial troponins were negative.  However initial EKG was concerning with ST depressions in the inferior leads.  EKG was repeated  with persistent changes.  His blood pressure improved marginally and temporarily with IV hydralazine and a dose of his home  medications.  I am concerned for hypertensive emergency given abnormal EKG and right eye vision changes.  Will consult ophthalmology for further recommendations but plan for admission to the hospital service for further evaluation and management of his hypertension  Spoke with Dr. Leafy Half with [facility] hospitalist service who agrees to assume care of patient and bring him into the hospital for further evaluation and management.  3:35 AM CONSULT: Spoke with Dr. Sherrine Maples with ophthalmology.  He is concerned for the the possibility of ischemic retinal neuropathy.  He does agree that ophthalmology evaluation is necessary though does not have to happen emergently.  He does agree with admission for evaluation of hypertension.  If the patient is discharged from the hospital before noon today (Friday) then he will see the patient in the office today.  Otherwise he may be evaluated in the hospital.  Dr. Sherrine Maples requests that the hospitalist service consult him specifically when the patient is up for discharge so that he can make the decision regarding when and where to evaluate the patient for his current symptoms.    Jeanie Sewer, PA-C 09/22/19 0746    Ward, Layla Maw, DO 09/25/19 2305

## 2019-09-21 NOTE — ED Provider Notes (Addendum)
[facility]   CSN: [id number] Arrival date & time: 09/21/19  1411     History Chief Complaint  Patient presents with  . Stroke Symptoms    Cleaster CorinWilliam Robert Daffron is a 59 y.o. male.  HPI    Patient is a 59 year old male with essential hypertension, hyperlipidemia, who presents to the emergency department today for evaluation of right eye visual changes.  States about 3 days ago he noticed that the vision in his right eye was dark.  Denies any specific visual field cuts.  Denies flashers/floaters.  Denies any eye pain or redness.  He has had no injury to the eye.  He denies any headache, lightheadedness, dizziness, numbness, weakness, or other neurologic symptoms.  He denies any chest pain, shortness of breath or decreased urine output.    Is supposed to be on BP meds but has not taken them for several months.   Past Medical History:  Diagnosis Date  . Essential hypertension   . Hyperlipidemia     Patient Active Problem List   Diagnosis Date Noted  . Mixed hyperlipidemia 06/11/2016  . Essential hypertension 08/14/2015    History reviewed. No pertinent surgical history.     Family History  Problem Relation Age of Onset  . Hyperlipidemia Mother   . Diabetes Mother   . Hypertension Mother   . Stroke Mother   . Hypertension Father   . Hyperlipidemia Father   . Stroke Father   . Diabetes Brother   . Diabetes Maternal Grandmother   . Diabetes Maternal Grandfather     Social History   Tobacco Use  . Smoking status: Never Smoker  . Smokeless tobacco: Never Used  Substance Use Topics  . Alcohol use: Not on file  . Drug use: No    Home Medications Prior to Admission medications   Medication Sig Start Date End Date Taking? Authorizing Provider  amLODipine (NORVASC) 5 MG tablet Take 1 tablet (5 mg total) by mouth daily. Patient not taking: Reported on 09/21/2019 08/14/18   Elvina SidleLauenstein, Kurt, MD    Allergies      Patient has no known allergies.  Review of Systems   Review of Systems  Constitutional: Negative for chills and fever.  HENT: Negative for ear pain and sore throat.   Eyes: Positive for visual disturbance. Negative for photophobia, pain, discharge and redness.  Respiratory: Negative for cough and shortness of breath.   Cardiovascular: Negative for chest pain.  Gastrointestinal: Negative for abdominal pain, constipation, diarrhea, nausea and vomiting.  Genitourinary: Negative for dysuria and hematuria.  Musculoskeletal: Negative for back pain.  Skin: Negative for rash.  Neurological: Negative for dizziness, weakness, light-headedness, numbness and headaches.  All other systems reviewed and are negative.   Physical Exam Updated Vital Signs BP (!) 169/104   Pulse 76   Temp 98.4 F (36.9 C)   Resp 12   Ht 6' (1.829 m)   SpO2 97%   BMI 28.50 kg/m   Physical Exam Vitals and nursing note reviewed.  Constitutional:      Appearance: He is well-developed.  HENT:     Head: Normocephalic and atraumatic.  Eyes:     Conjunctiva/sclera: Conjunctivae normal.  Cardiovascular:     Rate and Rhythm: Normal rate and regular rhythm.     Pulses: Normal pulses.     Heart sounds: Normal heart sounds. No murmur.  Pulmonary:     Effort: Pulmonary effort is normal. No respiratory distress.  Breath sounds: Normal breath sounds. No wheezing, rhonchi or rales.  Abdominal:     General: Bowel sounds are normal.     Palpations: Abdomen is soft.     Tenderness: There is no abdominal tenderness. There is no guarding or rebound.  Musculoskeletal:     Cervical back: Neck supple.  Skin:    General: Skin is warm and dry.  Neurological:     Mental Status: He is alert.     Comments: Mental Status:  Alert, thought content appropriate, able to give a coherent history. Speech fluent without evidence of aphasia. Able to follow 2 step commands without difficulty.  Cranial Nerves:  II:  Peripheral  visual fields grossly normal, pupils equal, round, reactive to light III,IV, VI: ptosis not present, extra-ocular motions intact bilaterally  V,VII: smile symmetric, facial light touch sensation equal VIII: hearing grossly normal to voice  X: uvula elevates symmetrically  XI: bilateral shoulder shrug symmetric and strong XII: midline tongue extension without fassiculations Motor:  Normal tone. 5/5 strength of BUE and BLE major muscle groups including strong and equal grip strength and dorsiflexion/plantar flexion Sensory: light touch normal in all extremities.     ED Results / Procedures / Treatments   Labs (all labs ordered are listed, but only abnormal results are displayed) Labs Reviewed  CBC - Abnormal; Notable for the following components:      Result Value   RDW 16.6 (*)    All other components within normal limits  COMPREHENSIVE METABOLIC PANEL - Abnormal; Notable for the following components:   Glucose, Bld 132 (*)    Total Bilirubin 1.3 (*)    All other components within normal limits  I-STAT CHEM 8, ED - Abnormal; Notable for the following components:   Glucose, Bld 125 (*)    All other components within normal limits  PROTIME-INR  APTT  DIFFERENTIAL  CBG MONITORING, ED  TROPONIN I (HIGH SENSITIVITY)  TROPONIN I (HIGH SENSITIVITY)    EKG EKG Interpretation  Date/Time:  Thursday September 21 2019 14:56:08 EDT Ventricular Rate:  80 PR Interval:  164 QRS Duration: 90 QT Interval:  394 QTC Calculation: 454 R Axis:   54 Text Interpretation: Normal sinus rhythm st depressions inferior new from 3/20 Confirmed by Meridee Score [phone]) on 09/21/2019 9:00:54 PM   Radiology CT Angio Head W or Wo Contrast  Result Date: 09/21/2019 CLINICAL DATA:  Right eye vision changes EXAM: CT ANGIOGRAPHY HEAD AND NECK TECHNIQUE: Multidetector CT imaging of the head and neck was performed using the standard protocol during bolus administration of intravenous contrast. Multiplanar CT  image reconstructions and MIPs were obtained to evaluate the vascular anatomy. Carotid stenosis measurements (when applicable) are obtained utilizing NASCET criteria, using the distal internal carotid diameter as the denominator. CONTRAST:  17mL OMNIPAQUE IOHEXOL 350 MG/ML SOLN COMPARISON:  None. FINDINGS: CTA NECK FINDINGS SKELETON: There is no bony spinal canal stenosis. No lytic or blastic lesion. OTHER NECK: Normal pharynx, larynx and major salivary glands. No cervical lymphadenopathy. Unremarkable thyroid gland. UPPER CHEST: No pneumothorax or pleural effusion. No nodules or masses. AORTIC ARCH: There is mild calcific atherosclerosis of the aortic arch. There is no aneurysm, dissection or hemodynamically significant stenosis of the visualized portion of the aorta. Conventional 3 vessel aortic branching pattern. The visualized proximal subclavian arteries are widely patent. RIGHT CAROTID SYSTEM: Normal without aneurysm, dissection or stenosis. LEFT CAROTID SYSTEM: Normal without aneurysm, dissection or stenosis. VERTEBRAL ARTERIES: Left dominant configuration. Both origins are clearly patent. There is no  dissection, occlusion or flow-limiting stenosis to the skull base (V1-V3 segments). CTA HEAD FINDINGS POSTERIOR CIRCULATION: --Vertebral arteries: Normal V4 segments. --Posterior inferior cerebellar arteries (PICA): Patent origins from the vertebral arteries. --Anterior inferior cerebellar arteries (AICA): Patent origins from the basilar artery. --Basilar artery: Normal. --Superior cerebellar arteries: Normal. --Posterior cerebral arteries: Normal. Both originate from the basilar artery. Posterior communicating arteries (p-comm) are diminutive or absent. ANTERIOR CIRCULATION: --Intracranial internal carotid arteries: Normal. --Anterior cerebral arteries (ACA): Normal. Both A1 segments are present. Patent anterior communicating artery (a-comm). --Middle cerebral arteries (MCA): Normal. VENOUS SINUSES: As  permitted by contrast timing, patent. ANATOMIC VARIANTS: None Review of the MIP images confirms the above findings. IMPRESSION: Normal CTA of the head and neck. Electronically Signed   By: Deatra Robinson M.D.   On: 09/21/2019 23:03   CT HEAD WO CONTRAST  Result Date: 09/21/2019 CLINICAL DATA:  Neuro deficit, subacute, possible stroke. Additional history provided: Vision changes in right eye which began 3 days ago, blindness in right eye, headache, dizziness EXAM: CT HEAD WITHOUT CONTRAST TECHNIQUE: Contiguous axial images were obtained from the base of the skull through the vertex without intravenous contrast. COMPARISON:  No pertinent prior studies available for comparison. FINDINGS: Brain: There is no evidence of acute intracranial hemorrhage, intracranial mass, midline shift or extra-axial fluid collection.No demarcated cortical infarction. Minimal ill-defined hypoattenuation within the cerebral white matter is nonspecific, but consistent with chronic small vessel ischemic disease. Vascular: No hyperdense vessel. Skull: Normal. Negative for fracture or focal lesion. Sinuses/Orbits: Visualized orbits demonstrate no acute abnormality. Small left maxillary sinus mucous retention cyst. Mild ethmoid, sphenoid and right maxillary sinus mucosal thickening. No significant mastoid effusion. IMPRESSION: No evidence of acute intracranial abnormality. Minimal chronic small vessel ischemic changes within the cerebral white matter. Mild paranasal sinus mucosal thickening. Left maxillary sinus mucous retention cyst. Electronically Signed   By: Jackey Loge DO   On: 09/21/2019 16:44   CT Angio Neck W and/or Wo Contrast  Result Date: 09/21/2019 CLINICAL DATA:  Right eye vision changes EXAM: CT ANGIOGRAPHY HEAD AND NECK TECHNIQUE: Multidetector CT imaging of the head and neck was performed using the standard protocol during bolus administration of intravenous contrast. Multiplanar CT image reconstructions and MIPs were  obtained to evaluate the vascular anatomy. Carotid stenosis measurements (when applicable) are obtained utilizing NASCET criteria, using the distal internal carotid diameter as the denominator. CONTRAST:  48mL OMNIPAQUE IOHEXOL 350 MG/ML SOLN COMPARISON:  None. FINDINGS: CTA NECK FINDINGS SKELETON: There is no bony spinal canal stenosis. No lytic or blastic lesion. OTHER NECK: Normal pharynx, larynx and major salivary glands. No cervical lymphadenopathy. Unremarkable thyroid gland. UPPER CHEST: No pneumothorax or pleural effusion. No nodules or masses. AORTIC ARCH: There is mild calcific atherosclerosis of the aortic arch. There is no aneurysm, dissection or hemodynamically significant stenosis of the visualized portion of the aorta. Conventional 3 vessel aortic branching pattern. The visualized proximal subclavian arteries are widely patent. RIGHT CAROTID SYSTEM: Normal without aneurysm, dissection or stenosis. LEFT CAROTID SYSTEM: Normal without aneurysm, dissection or stenosis. VERTEBRAL ARTERIES: Left dominant configuration. Both origins are clearly patent. There is no dissection, occlusion or flow-limiting stenosis to the skull base (V1-V3 segments). CTA HEAD FINDINGS POSTERIOR CIRCULATION: --Vertebral arteries: Normal V4 segments. --Posterior inferior cerebellar arteries (PICA): Patent origins from the vertebral arteries. --Anterior inferior cerebellar arteries (AICA): Patent origins from the basilar artery. --Basilar artery: Normal. --Superior cerebellar arteries: Normal. --Posterior cerebral arteries: Normal. Both originate from the basilar artery. Posterior communicating arteries (p-comm) are diminutive  or absent. ANTERIOR CIRCULATION: --Intracranial internal carotid arteries: Normal. --Anterior cerebral arteries (ACA): Normal. Both A1 segments are present. Patent anterior communicating artery (a-comm). --Middle cerebral arteries (MCA): Normal. VENOUS SINUSES: As permitted by contrast timing, patent.  ANATOMIC VARIANTS: None Review of the MIP images confirms the above findings. IMPRESSION: Normal CTA of the head and neck. Electronically Signed   By: Ulyses Jarred M.D.   On: 09/21/2019 23:03    Procedures Procedures (including critical care time)    Visual Acuity  Right Eye Distance:   Left Eye Distance:   Bilateral Distance:    Right Eye Near: R Near: 20/50 Left Eye Near:  L Near: 20/30 Bilateral Near:  20/25   .CRITICAL CARE Performed by: Rodney Booze   Total critical care time: 40 minutes  Critical care time was exclusive of separately billable procedures and treating other patients.  Critical care was necessary to treat or prevent imminent or life-threatening deterioration.  Critical care was time spent personally by me on the following activities: development of treatment plan with patient and/or surrogate as well as nursing, discussions with consultants, evaluation of patient's response to treatment, examination of patient, obtaining history from patient or surrogate, ordering and performing treatments and interventions, ordering and review of laboratory studies, ordering and review of radiographic studies, pulse oximetry and re-evaluation of patient's condition.    Medications Ordered in ED Medications  sodium chloride flush (NS) 0.9 % injection 3 mL (has no administration in time range)  iohexol (OMNIPAQUE) 350 MG/ML injection 75 mL (75 mLs Intravenous Contrast Given 09/21/19 2235)  hydrALAZINE (APRESOLINE) injection 5 mg (5 mg Intravenous Given 09/21/19 2301)    ED Course  I have reviewed the triage vital signs and the nursing notes.  Pertinent labs & imaging results that were available during my care of the patient were reviewed by me and considered in my medical decision making (see chart for details).  Clinical Course as of Sep 22 10  Thu Sep 20, 3833  [id number] 59 year old male here with 3 days of diminished vision in his right eye.  No pain.  Also has not  taken his blood pressure in over a month and has elevated blood pressures here.  Initial imaging CT head noncontrast was unremarkable.  Labs show normal renal function.  Blood pressure is come down somewhat on its own.  Likely needs some further imaging and possibly referral to ophthalmology.   [MB]    Clinical Course User Index [MB] Hayden Rasmussen, MD   MDM Rules/Calculators/A&P                      Pt is a 59 y/o male with a h/o HTN who is not currently on medications presenting for eval of right eye vision changes for 3 days.   Visual acuity is slightly decreased to the right eye. Neuro exam is otherwise normal.   Reviewed labs Cbc w/o leukocytosis or anemia cmp nonacute coags are neg Trop pending  EKG with Normal sinus rhythm st depressions inferior new from 3/20  CT head with no evidence of acute intracranial abnormality. Minimal chronic small vessel ischemic changes within the cerebral white matter. Mild paranasal sinus mucosal thickening. Left maxillary sinus mucous retention cyst.  Will get CTA head/neck and MRI to r/o CVA. Will give BP meds and reassess. Pt will likely need admit due to concern for htn emergency  Care transitioned to [facility], PA-C with plan to f/u on pending w/u  Final  Clinical Impression(s) / ED Diagnoses Final diagnoses:  None    Rx / DC Orders ED Discharge Orders    None       Rayne Du 09/22/19 0012    Terrilee Files, MD 09/22/19 1034    [address] Kelleen Stolze S, PA-C 09/22/19 2108    Terrilee Files, MD 09/22/19 2232

## 2019-09-22 ENCOUNTER — Encounter (HOSPITAL_COMMUNITY): Payer: Self-pay | Admitting: Internal Medicine

## 2019-09-22 ENCOUNTER — Encounter: Payer: Self-pay | Admitting: Ophthalmology

## 2019-09-22 DIAGNOSIS — E782 Mixed hyperlipidemia: Secondary | ICD-10-CM | POA: Diagnosis not present

## 2019-09-22 DIAGNOSIS — F22 Delusional disorders: Secondary | ICD-10-CM

## 2019-09-22 DIAGNOSIS — H53121 Transient visual loss, right eye: Secondary | ICD-10-CM

## 2019-09-22 DIAGNOSIS — I161 Hypertensive emergency: Principal | ICD-10-CM

## 2019-09-22 LAB — URINALYSIS, ROUTINE W REFLEX MICROSCOPIC
Bilirubin Urine: NEGATIVE
Glucose, UA: NEGATIVE mg/dL
Hgb urine dipstick: NEGATIVE
Ketones, ur: NEGATIVE mg/dL
Leukocytes,Ua: NEGATIVE
Nitrite: NEGATIVE
Protein, ur: NEGATIVE mg/dL
Specific Gravity, Urine: >1.046 — ABNORMAL HIGH (ref 1.005–1.030)
pH: 6 (ref 5.0–8.0)

## 2019-09-22 LAB — TROPONIN I (HIGH SENSITIVITY)
Troponin I (High Sensitivity): 12 ng/L (ref <18)
Troponin I (High Sensitivity): 13 ng/L (ref <18)

## 2019-09-22 MED ORDER — ACETAMINOPHEN 650 MG RE SUPP: 650.0000 mg | Freq: Four times a day (QID) | RECTAL | Status: DC | PRN

## 2019-09-22 MED ORDER — HYDRALAZINE HCL 20 MG/ML IJ SOLN: 5.0000 mg | Freq: Once | INTRAMUSCULAR | Status: DC

## 2019-09-22 MED ORDER — POLYETHYLENE GLYCOL 3350 17 G PO PACK: 17.0000 g | PACK | Freq: Every day | ORAL | Status: DC | PRN

## 2019-09-22 MED ORDER — ACETAMINOPHEN 325 MG PO TABS: 650.0000 mg | ORAL_TABLET | Freq: Four times a day (QID) | ORAL | Status: DC | PRN

## 2019-09-22 MED ORDER — HYDROCHLOROTHIAZIDE 25 MG PO TABS: 25.0000 mg | ORAL_TABLET | Freq: Every day | ORAL | Status: DC

## 2019-09-22 MED ORDER — ENALAPRILAT 1.25 MG/ML IV SOLN
1.2500 mg | Freq: Four times a day (QID) | INTRAVENOUS | Status: DC | PRN
  Filled 2019-09-22: qty 1

## 2019-09-22 MED ORDER — AMLODIPINE BESYLATE 5 MG PO TABS: 5.0000 mg | ORAL_TABLET | Freq: Every day | ORAL | Status: DC

## 2019-09-22 MED ORDER — ONDANSETRON HCL 4 MG PO TABS: 4.0000 mg | ORAL_TABLET | Freq: Four times a day (QID) | ORAL | Status: DC | PRN

## 2019-09-22 MED ORDER — HYDRALAZINE HCL 20 MG/ML IJ SOLN
5.0000 mg | Freq: Once | INTRAMUSCULAR | Status: AC
  Administered 2019-09-22: 03:00:00 5 mg via INTRAVENOUS
  Filled 2019-09-22: qty 1

## 2019-09-22 MED ORDER — ENOXAPARIN SODIUM 40 MG/0.4ML ~~LOC~~ SOLN: 40.0000 mg | SUBCUTANEOUS | Status: DC

## 2019-09-22 MED ORDER — ATORVASTATIN CALCIUM 40 MG PO TABS: 40.0000 mg | ORAL_TABLET | Freq: Every day | ORAL | Status: DC

## 2019-09-22 MED ORDER — ONDANSETRON HCL 4 MG/2ML IJ SOLN: 4.0000 mg | Freq: Four times a day (QID) | INTRAMUSCULAR | Status: DC | PRN

## 2019-09-22 MED ORDER — AMLODIPINE BESYLATE 5 MG PO TABS
5.0000 mg | ORAL_TABLET | Freq: Once | ORAL | Status: AC
  Administered 2019-09-22: 5 mg via ORAL
  Filled 2019-09-22: qty 1

## 2019-09-22 NOTE — ED Notes (Signed)
Pt became more restless, advised he wanted to leave AMA. Pt education provided and provider notified per protocol. Pt verbalized understanding of risks of leaving. AMA form signed per protocol. Pt left facility.

## 2019-09-22 NOTE — H&P (Signed)
History and Physical    David Maddox GTX:[id number] DOB: Dec 08, 1960 DOA: 09/21/2019  PCP: [place], Betty G, MD  Patient coming from: Home  Chief Complaint:  Right visual loss  HPI:    59 year old male with past medical history of hypertension, hyperlipidemia who presents to [facility] emergency department with complaints of right visual field loss.  Patient is angry making it difficult to obtain a full history.  Patient's planes that approximately 3 days ago he noticed that the vision in his right eye started to become dark.  Patient dates that this gradually developed over the next several days.  Darkening of the right visual fields has been constant.  Patient denies any associated pain in the eye or headaches.  Patient denies any specific floaters or spots in his vision.  Patient denies any slurring of speech, changes in gait or focal weakness.  Of note, patient's blood pressure has been markedly elevated as of late, prompting him to present to urgent care clinic in March where he received a prescription for amlodipine.  It is unclear as to whether or not patient has been taking this medication.  Patient eventually presented to [facility] emergency department for evaluation.  Upon evaluation patient was found to have markedly elevated blood pressures as high as 221/121.  MRI of the brain was performed revealing no evidence of acute intracranial abnormality.  CT angiogram of the head and neck was also performed and was unremarkable.  The emergency room provider discussed the case with Dr. Eulas Post who stated that he could come evaluate the patient while hospitalized sometime on 4/16 and requested that the hospitalist paged him on day shift.  Patient states that he feels that he is losing vision in his right eye due to 5G.  Dates that the 5G from his new iPad is likely what "poisoned" his eye.  Patient states that 5G waves regularly "blow out the windows in my  truck."   Review of Systems: A 10-system review of systems has been performed and all systems are negative with the exception of what is listed in the HPI.   Past Medical History:  Diagnosis Date   Essential hypertension    Hyperlipidemia     History reviewed. No pertinent surgical history.   reports that he has never smoked. He has never used smokeless tobacco. He reports that he does not use drugs. No history on file for alcohol.  No Known Allergies  Family History  Problem Relation Age of Onset   Hyperlipidemia Mother    Diabetes Mother    Hypertension Mother    Stroke Mother    Hypertension Father    Hyperlipidemia Father    Stroke Father    Diabetes Brother    Diabetes Maternal Grandmother    Diabetes Maternal Grandfather      Prior to Admission medications   Medication Sig Start Date End Date Taking? Authorizing Provider  amLODipine (NORVASC) 5 MG tablet Take 1 tablet (5 mg total) by mouth daily. Patient not taking: Reported on 09/21/2019 08/14/18   Robyn Haber, MD    Physical Exam: Vitals:   09/22/19 0046 09/22/19 0130 09/22/19 0304 09/22/19 0330  BP: (!) 170/111 (!) 168/112 (!) 152/71 (!) 178/113  Pulse:  76  85  Resp:  14  18  Temp:      TempSrc:      SpO2:  99%  99%  Height:        Constitutional: Acute alert and oriented x3, patient  is visibly angry but not in any acute distress. Skin: no rashes, no lesions, good skin turgor noted. Eyes: Small hyphema of the right eye noted.  Visual fields are intact.  Visual acuity is grossly normal.  Pupils are equally reactive to light.  No evidence of scleral icterus or conjunctival pallor.  ENMT: Mucous membranes are moist. Posterior pharynx clear of any exudate or lesions. Normal dentition.   Neck: normal, supple, no masses, no thyromegaly Respiratory: clear to auscultation bilaterally, no wheezing no org, no crackles. Normal respiratory effort. No accessory muscle use.  Cardiovascular: Regular  rate and rhythm, no murmurs / rubs / gallops. No extremity edema. 2+ pedal pulses. No carotid bruits.  Back:   Nontender without crepitus or deformity. Abdomen: Abdomen is soft and nontender.  No evidence of intra-abdominal masses.  Positive bowel sounds noted in all quadrants.   Musculoskeletal: No joint deformity upper and lower extremities. Good ROM, no contractures. Normal muscle tone.  Neurologic: CN 2-12 grossly intact. Sensation intact, strength noted to be 5 out of 5 in all 4 extremities.  Patient is following all commands.  Patient is responsive to verbal stimuli.   Psychiatric: Angry mood with labile affect. Patient seems to be voicing delusions of paranoia stating that 5G from his iPad is what "poisoned" his right eye and that 5G regularly causes the windows of his truck to "blow out."  Patient states that the medical staff has been "giving me all lies.".  Patient seems to possess insight as to theircurrent situation.     Labs on Admission: I have personally reviewed following labs and imaging studies -   CBC: Recent Labs  Lab 09/21/19 1501 09/21/19 1506  WBC 6.2  --   NEUTROABS 4.0  --   HGB 15.5 16.3  HCT 48.1 48.0  MCV 88.4  --   PLT 229  --    Basic Metabolic Panel: Recent Labs  Lab 09/21/19 1501 09/21/19 1506  NA 137 139  K 4.0 4.0  CL 101 101  CO2 29  --   GLUCOSE 132* 125*  BUN 11 13  CREATININE 1.01 1.10  CALCIUM 9.1  --    GFR: CrCl cannot be calculated (Unknown ideal weight.). Liver Function Tests: Recent Labs  Lab 09/21/19 1501  AST 21  ALT 15  ALKPHOS 58  BILITOT 1.3*  PROT 7.0  ALBUMIN 4.3   No results for input(s): LIPASE, AMYLASE in the last 168 hours. No results for input(s): AMMONIA in the last 168 hours. Coagulation Profile: Recent Labs  Lab 09/21/19 1501  INR 1.0   Cardiac Enzymes: No results for input(s): CKTOTAL, CKMB, CKMBINDEX, TROPONINI in the last 168 hours. BNP (last 3 results) No results for input(s): PROBNP in the last  8760 hours. HbA1C: No results for input(s): HGBA1C in the last 72 hours. CBG: No results for input(s): GLUCAP in the last 168 hours. Lipid Profile: No results for input(s): CHOL, HDL, LDLCALC, TRIG, CHOLHDL, LDLDIRECT in the last 72 hours. Thyroid Function Tests: No results for input(s): TSH, T4TOTAL, FREET4, T3FREE, THYROIDAB in the last 72 hours. Anemia Panel: No results for input(s): VITAMINB12, FOLATE, FERRITIN, TIBC, IRON, RETICCTPCT in the last 72 hours. Urine analysis:    Component Value Date/Time   COLORURINE YELLOW 09/22/2019 0309   APPEARANCEUR CLEAR 09/22/2019 0309   LABSPEC >1.046 (H) 09/22/2019 0309   PHURINE 6.0 09/22/2019 0309   GLUCOSEU NEGATIVE 09/22/2019 0309   HGBUR NEGATIVE 09/22/2019 0309   BILIRUBINUR NEGATIVE 09/22/2019 0309   KETONESUR  NEGATIVE 09/22/2019 0309   PROTEINUR NEGATIVE 09/22/2019 0309   NITRITE NEGATIVE 09/22/2019 0309   LEUKOCYTESUR NEGATIVE 09/22/2019 0309    Radiological Exams on Admission: CT Angio Head W or Wo Contrast  Result Date: 09/21/2019 CLINICAL DATA:  Right eye vision changes EXAM: CT ANGIOGRAPHY HEAD AND NECK TECHNIQUE: Multidetector CT imaging of the head and neck was performed using the standard protocol during bolus administration of intravenous contrast. Multiplanar CT image reconstructions and MIPs were obtained to evaluate the vascular anatomy. Carotid stenosis measurements (when applicable) are obtained utilizing NASCET criteria, using the distal internal carotid diameter as the denominator. CONTRAST:  51m OMNIPAQUE IOHEXOL 350 MG/ML SOLN COMPARISON:  None. FINDINGS: CTA NECK FINDINGS SKELETON: There is no bony spinal canal stenosis. No lytic or blastic lesion. OTHER NECK: Normal pharynx, larynx and major salivary glands. No cervical lymphadenopathy. Unremarkable thyroid gland. UPPER CHEST: No pneumothorax or pleural effusion. No nodules or masses. AORTIC ARCH: There is mild calcific atherosclerosis of the aortic arch. There is  no aneurysm, dissection or hemodynamically significant stenosis of the visualized portion of the aorta. Conventional 3 vessel aortic branching pattern. The visualized proximal subclavian arteries are widely patent. RIGHT CAROTID SYSTEM: Normal without aneurysm, dissection or stenosis. LEFT CAROTID SYSTEM: Normal without aneurysm, dissection or stenosis. VERTEBRAL ARTERIES: Left dominant configuration. Both origins are clearly patent. There is no dissection, occlusion or flow-limiting stenosis to the skull base (V1-V3 segments). CTA HEAD FINDINGS POSTERIOR CIRCULATION: --Vertebral arteries: Normal V4 segments. --Posterior inferior cerebellar arteries (PICA): Patent origins from the vertebral arteries. --Anterior inferior cerebellar arteries (AICA): Patent origins from the basilar artery. --Basilar artery: Normal. --Superior cerebellar arteries: Normal. --Posterior cerebral arteries: Normal. Both originate from the basilar artery. Posterior communicating arteries (p-comm) are diminutive or absent. ANTERIOR CIRCULATION: --Intracranial internal carotid arteries: Normal. --Anterior cerebral arteries (ACA): Normal. Both A1 segments are present. Patent anterior communicating artery (a-comm). --Middle cerebral arteries (MCA): Normal. VENOUS SINUSES: As permitted by contrast timing, patent. ANATOMIC VARIANTS: None Review of the MIP images confirms the above findings. IMPRESSION: Normal CTA of the head and neck. Electronically Signed   By: KUlyses JarredM.D.   On: 09/21/2019 23:03   CT HEAD WO CONTRAST  Result Date: 09/21/2019 CLINICAL DATA:  Neuro deficit, subacute, possible stroke. Additional history provided: Vision changes in right eye which began 3 days ago, blindness in right eye, headache, dizziness EXAM: CT HEAD WITHOUT CONTRAST TECHNIQUE: Contiguous axial images were obtained from the base of the skull through the vertex without intravenous contrast. COMPARISON:  No pertinent prior studies available for  comparison. FINDINGS: Brain: There is no evidence of acute intracranial hemorrhage, intracranial mass, midline shift or extra-axial fluid collection.No demarcated cortical infarction. Minimal ill-defined hypoattenuation within the cerebral white matter is nonspecific, but consistent with chronic small vessel ischemic disease. Vascular: No hyperdense vessel. Skull: Normal. Negative for fracture or focal lesion. Sinuses/Orbits: Visualized orbits demonstrate no acute abnormality. Small left maxillary sinus mucous retention cyst. Mild ethmoid, sphenoid and right maxillary sinus mucosal thickening. No significant mastoid effusion. IMPRESSION: No evidence of acute intracranial abnormality. Minimal chronic small vessel ischemic changes within the cerebral white matter. Mild paranasal sinus mucosal thickening. Left maxillary sinus mucous retention cyst. Electronically Signed   By: KKellie SimmeringDO   On: 09/21/2019 16:44   CT Angio Neck W and/or Wo Contrast  Result Date: 09/21/2019 CLINICAL DATA:  Right eye vision changes EXAM: CT ANGIOGRAPHY HEAD AND NECK TECHNIQUE: Multidetector CT imaging of the head and neck was performed using the standard  protocol during bolus administration of intravenous contrast. Multiplanar CT image reconstructions and MIPs were obtained to evaluate the vascular anatomy. Carotid stenosis measurements (when applicable) are obtained utilizing NASCET criteria, using the distal internal carotid diameter as the denominator. CONTRAST:  57m OMNIPAQUE IOHEXOL 350 MG/ML SOLN COMPARISON:  None. FINDINGS: CTA NECK FINDINGS SKELETON: There is no bony spinal canal stenosis. No lytic or blastic lesion. OTHER NECK: Normal pharynx, larynx and major salivary glands. No cervical lymphadenopathy. Unremarkable thyroid gland. UPPER CHEST: No pneumothorax or pleural effusion. No nodules or masses. AORTIC ARCH: There is mild calcific atherosclerosis of the aortic arch. There is no aneurysm, dissection or  hemodynamically significant stenosis of the visualized portion of the aorta. Conventional 3 vessel aortic branching pattern. The visualized proximal subclavian arteries are widely patent. RIGHT CAROTID SYSTEM: Normal without aneurysm, dissection or stenosis. LEFT CAROTID SYSTEM: Normal without aneurysm, dissection or stenosis. VERTEBRAL ARTERIES: Left dominant configuration. Both origins are clearly patent. There is no dissection, occlusion or flow-limiting stenosis to the skull base (V1-V3 segments). CTA HEAD FINDINGS POSTERIOR CIRCULATION: --Vertebral arteries: Normal V4 segments. --Posterior inferior cerebellar arteries (PICA): Patent origins from the vertebral arteries. --Anterior inferior cerebellar arteries (AICA): Patent origins from the basilar artery. --Basilar artery: Normal. --Superior cerebellar arteries: Normal. --Posterior cerebral arteries: Normal. Both originate from the basilar artery. Posterior communicating arteries (p-comm) are diminutive or absent. ANTERIOR CIRCULATION: --Intracranial internal carotid arteries: Normal. --Anterior cerebral arteries (ACA): Normal. Both A1 segments are present. Patent anterior communicating artery (a-comm). --Middle cerebral arteries (MCA): Normal. VENOUS SINUSES: As permitted by contrast timing, patent. ANATOMIC VARIANTS: None Review of the MIP images confirms the above findings. IMPRESSION: Normal CTA of the head and neck. Electronically Signed   By: KUlyses JarredM.D.   On: 09/21/2019 23:03   MR BRAIN WO CONTRAST  Result Date: 09/22/2019 CLINICAL DATA:  Right eye vision changes EXAM: MRI HEAD WITHOUT CONTRAST TECHNIQUE: Multiplanar, multiecho pulse sequences of the brain and surrounding structures were obtained without intravenous contrast. COMPARISON:  None. FINDINGS: BRAIN: No acute infarct, acute hemorrhage or extra-axial collection. Multifocal white matter hyperintensity, most commonly due to chronic ischemic microangiopathy. Normal volume of brain  parenchyma and CSF spaces. Midline structures are normal. VASCULAR: Major flow voids are preserved. Susceptibility-sensitive sequences show no chronic microhemorrhage or superficial siderosis. SKULL AND UPPER CERVICAL SPINE: Normal calvarium and skull base. Visualized upper cervical spine and soft tissues are normal. SINUSES/ORBITS: No paranasal sinus fluid levels or advanced mucosal thickening. No mastoid or middle ear effusion. Normal orbits. IMPRESSION: 1. No acute intracranial abnormality. 2. Mild nonspecific white matter hyperintensity, most commonly indicating chronic small vessel ischemia. Electronically Signed   By: KUlyses JarredM.D.   On: 09/22/2019 00:14    EKG: Personally reviewed.  Rhythm is normal sinus rhythm with heart rate of 82 bpm.  Notable development of T wave inversions in the inferior and lateral leads, and acute change from prior EKGs.  Assessment/Plan Active Problems:   Hypertensive emergency   Patient presenting with markedly elevated blood pressures as high as 221/121 in the emergency department.  While patient does present with visual changes of the right visual fields, there is no evidence of acute stroke on MRI  While patient is chest pain-free, there is evidence of dynamic ST segment changes on EKG performed today in the emergency department  Placing patient on telemetry  Cycling cardiac enzymes  Shading combination of oral antihypertensive therapy with both amlodipine and hydrochlorothiazide with as needed intravenous Vasotec for excessively elevated blood pressures.  Performing serial EKGs, will consider whether patient would benefit from noninvasive ischemic assessment while hospitalized versus shortly after discharge.    Episode of visual loss of right eye   MRI does not reveal any evidence of stroke  Dr. Eulas Post with ophthalmology has been contacted and stated that he would be willing to see the patient while hospitalized later today but would like the  daytime hospitalist to contact him and let him know whether the patient will be discharged earlier in the day-if so he would then recommend that the patient see him in clinic.    Mixed hyperlipidemia   Known history of significant hyperlipidemia, not currently on lipid-lowering therapy  Initiating atorvastatin 40 mg daily  Obtain lipid panel    Paranoid delusion ([place])   Patient does seem to exhibit delusions of paranoia including stating that the medical staff is "feeding him all lies" and stating that 5G from his iPad is poisoning him  Despite this, I do not believe that the patient warrants psychiatric evaluation at this time.     Code Status:  Full code Family Communication: Declined Disposition Plan: Patient is anticipated to be discharged to home once patient has met maximum benefit from current hospitalization.   Consults called: Dr. Eulas Post with Ophthalmology has been contacted by the ED staff.   Admission status: Patient will be admitted to Observation and is anticipated to remain in the hospital for less than 2 midnights.   Vernelle Emerald MD [facility] Pager [phone]  If 7PM-7AM, please contact night-coverage [email or web address] Use universal [place] password for that web site. If you do not have the password, please call the hospital operator.  09/22/2019, 4:25 AM

## 2019-09-22 NOTE — ED Notes (Signed)
Pt refuses to be tested for COVID, advising "we would obtain his DNA sample and sell it on the black market" and that "it is a mark of the beast and against his religion". Treatment team notified, aware pt is presenting with paranoid delusions. Pt does not present with SI/HI at this time and wants to receive medical treatment. Pt is otherwise compliant with treatment interventions and is calm at this time.

## 2019-09-22 NOTE — ED Notes (Signed)
Pt's presenting with increased paranoia and delusions. Pt refused labs and medications. Pt advised he wanted to leave AMA. Provider notified and de-escalation techniques implemented. Pt agreed to stay and be seen by consulting provider. Per admitting, minimal stimuli until consult is completed, as pt is at high risk for permanent vision loss if he is to leave AMA. MD advised pt's judgment is not impaired to initiate IVC protocol.

## 2019-09-22 NOTE — Telephone Encounter (Signed)
I spoke with the patient by phone 09/22/19 @ 12:48. I explained I am the ophthalmologist on call and recommend the patient be seen to evaluate the cause of his vision loss OD and requested he come to our office for outpatient ophthalmology evaluation. He will be coming to our [facility] office of [facility] at 1:45PM.  - Shon Millet, MD Ophthalmology [facility]

## 2019-09-22 NOTE — ED Notes (Signed)
Pt refused blood draw from this NT. Joselyn Glassman, RN made aware, attempting to draw from IV

## 2021-11-28 IMAGING — CT CT ANGIO NECK
3 of 7 series · 9 of 34 positions shown · IV contrast (OMNI 350)
Comparison: None.

CLINICAL DATA: Right eye vision changes

EXAM:
CT ANGIOGRAPHY HEAD AND NECK
TECHNIQUE: Multidetector CT imaging of the head and neck was performed using
the standard protocol during bolus administration of intravenous
contrast. Multiplanar CT image reconstructions and MIPs were
obtained to evaluate the vascular anatomy. Carotid stenosis
measurements (when applicable) are obtained utilizing NASCET
criteria, using the distal internal carotid diameter as the
denominator.
CONTRAST:  75mL OMNIPAQUE IOHEXOL 350 MG/ML SOLN

[Series 5: cta neck · axial · 0.44mm/px · z∈[-144,-24]mm · 2 of 180 slices shown]
[im 60/180  soft-tissue]
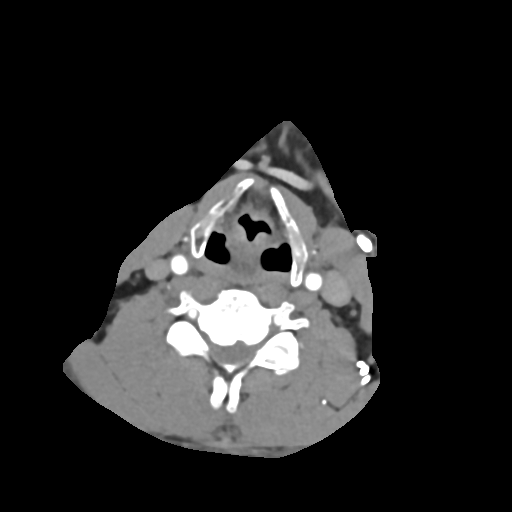
[im 120/180  soft-tissue]
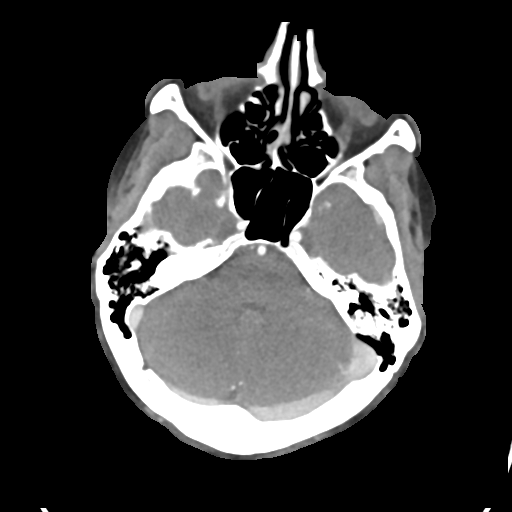

[Series 7: cta neck axial · axial · 0.39mm/px · z∈[-210,+44]mm · 6 of 357 slices shown]
[im 51/357  soft-tissue]
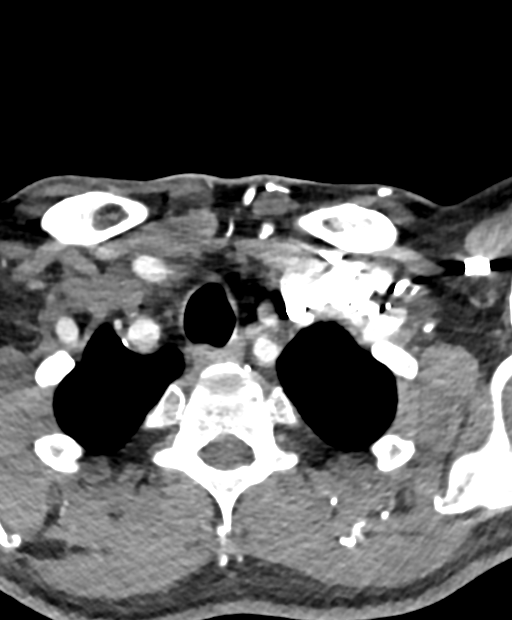
[im 102/357  bone]
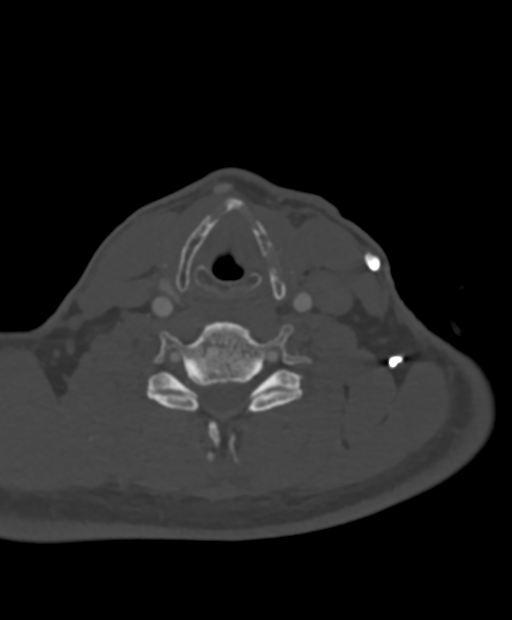
[im 153/357  soft-tissue]
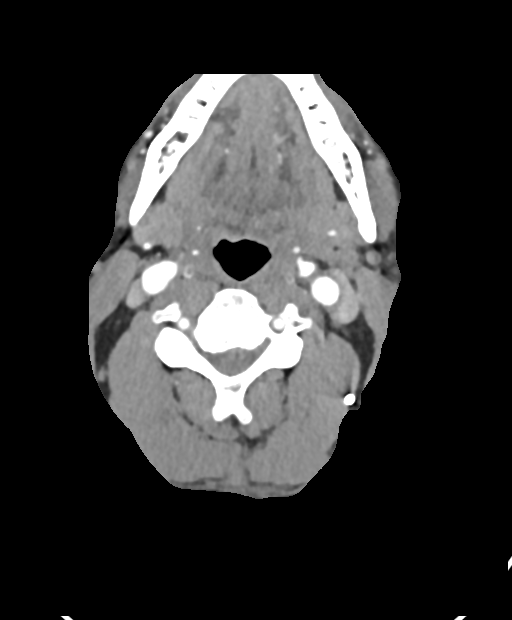
[im 204/357  bone]
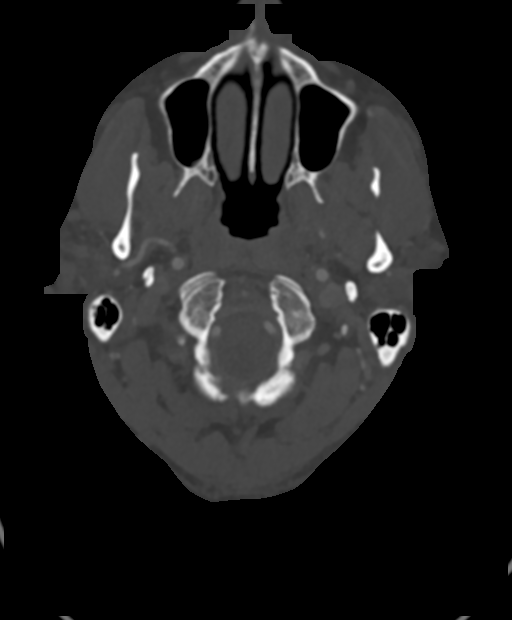
[im 255/357  soft-tissue]
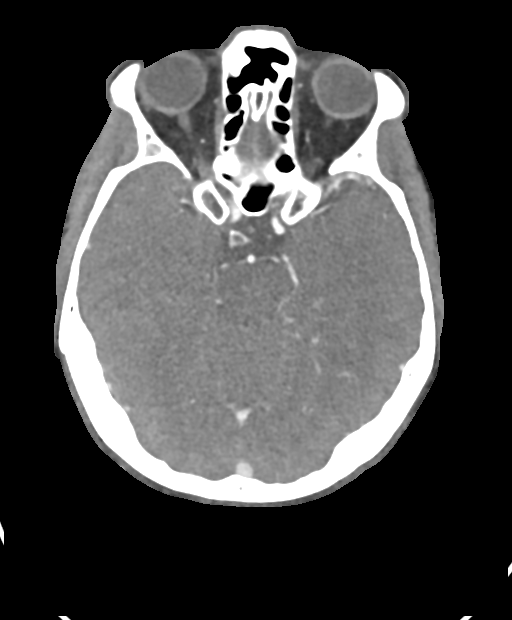
[im 306/357  bone]
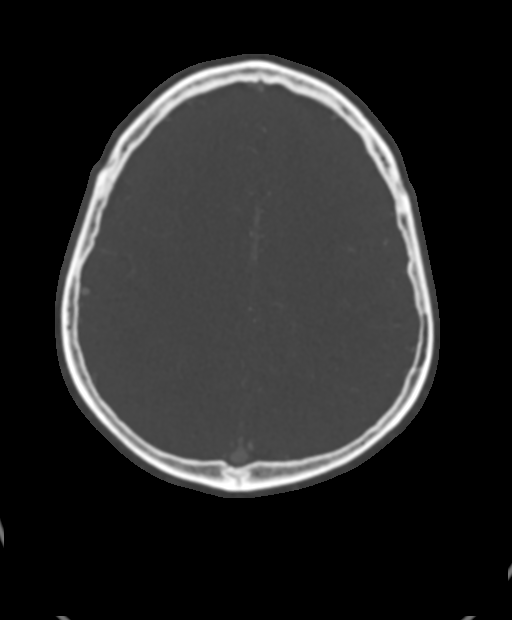

[Series 9: cta neck sagittal · sagittal · 0.47mm/px · 1 of 201 slices shown]
[im 52/201  soft-tissue]
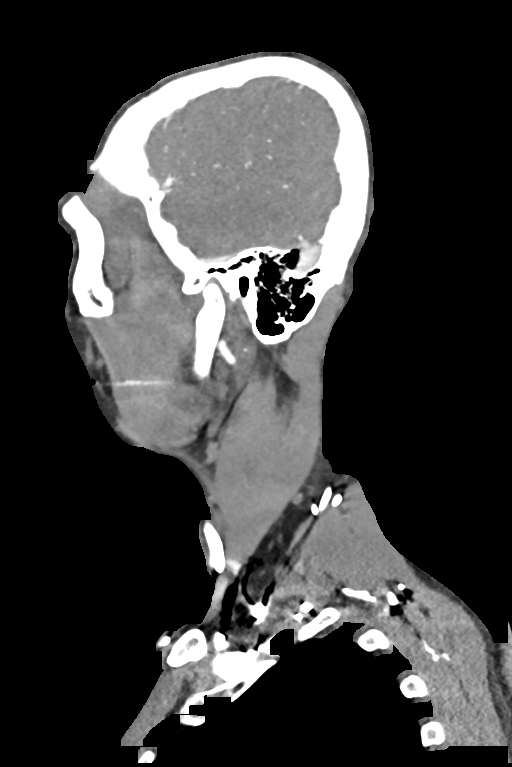

[9 of 34 positions shown; findings below may reference images not displayed]

FINDINGS: CTA NECK FINDINGS

SKELETON: There is no bony spinal canal stenosis. No lytic or
blastic lesion.

OTHER NECK: Normal pharynx, larynx and major salivary glands. No
cervical lymphadenopathy. Unremarkable thyroid gland.

UPPER CHEST: No pneumothorax or pleural effusion. No nodules or
masses.

AORTIC ARCH:

There is mild calcific atherosclerosis of the aortic arch. There is
no aneurysm, dissection or hemodynamically significant stenosis of
the visualized portion of the aorta. Conventional 3 vessel aortic
branching pattern. The visualized proximal subclavian arteries are
widely patent.

RIGHT CAROTID SYSTEM: Normal without aneurysm, dissection or
stenosis.

LEFT CAROTID SYSTEM: Normal without aneurysm, dissection or
stenosis.

VERTEBRAL ARTERIES: Left dominant configuration. Both origins are
clearly patent. There is no dissection, occlusion or flow-limiting
stenosis to the skull base (V1-V3 segments).

CTA HEAD FINDINGS

POSTERIOR CIRCULATION:

--Vertebral arteries: Normal V4 segments.

--Posterior inferior cerebellar arteries (PICA): Patent origins from
the vertebral arteries.

--Anterior inferior cerebellar arteries (AICA): Patent origins from
the basilar artery.

--Basilar artery: Normal.

--Superior cerebellar arteries: Normal.

--Posterior cerebral arteries: Normal. Both originate from the
basilar artery. Posterior communicating arteries (p-comm) are
diminutive or absent.

ANTERIOR CIRCULATION:

--Intracranial internal carotid arteries: Normal.

--Anterior cerebral arteries (ACA): Normal. Both A1 segments are
present. Patent anterior communicating artery (a-comm).

--Middle cerebral arteries (MCA): Normal.

VENOUS SINUSES: As permitted by contrast timing, patent.

ANATOMIC VARIANTS: None

Review of the MIP images confirms the above findings.
IMPRESSION: Normal CTA of the head and neck.

## 2021-11-28 IMAGING — CT CT HEAD W/O CM
4 series · 16 of 47 positions shown, 18 images · non-contrast
Comparison: No pertinent prior studies available for comparison.

CLINICAL DATA: Neuro deficit, subacute, possible stroke. Additional
history provided: Vision changes in right eye which began 3 days
ago, blindness in right eye, headache, dizziness

EXAM:
CT HEAD WITHOUT CONTRAST
TECHNIQUE: Contiguous axial images were obtained from the base of the skull
through the vertex without intravenous contrast.

[Series 3: head without · axial · non-contrast · 0.46mm/px · z∈[-74,+62]mm · 7 of 37 slices shown, 9 images]
[im 5/37  brain]
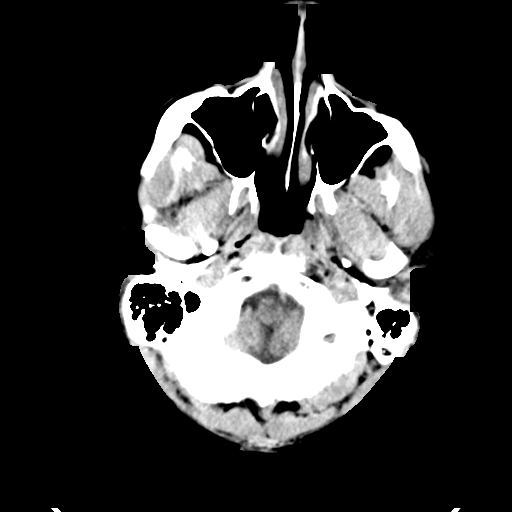
[im 5/37  bone]
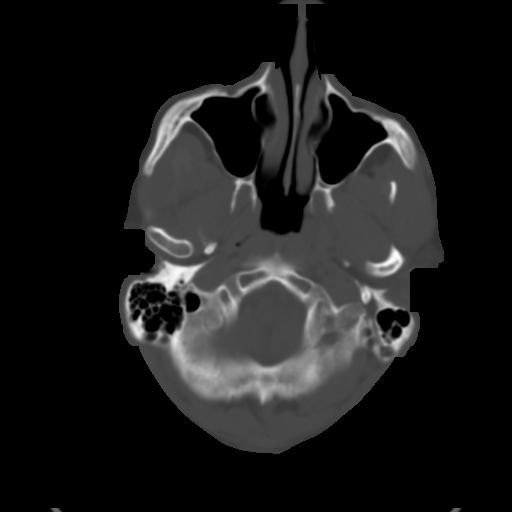
[im 10/37  brain]
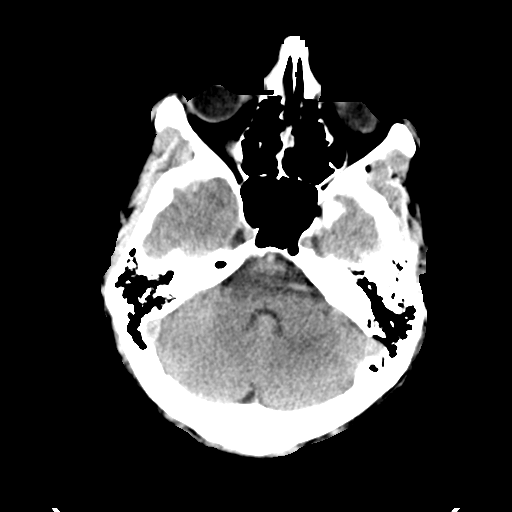
[im 14/37  brain]
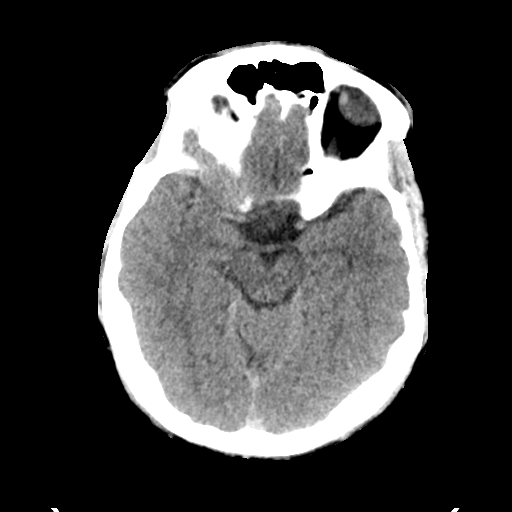
[im 19/37  brain]
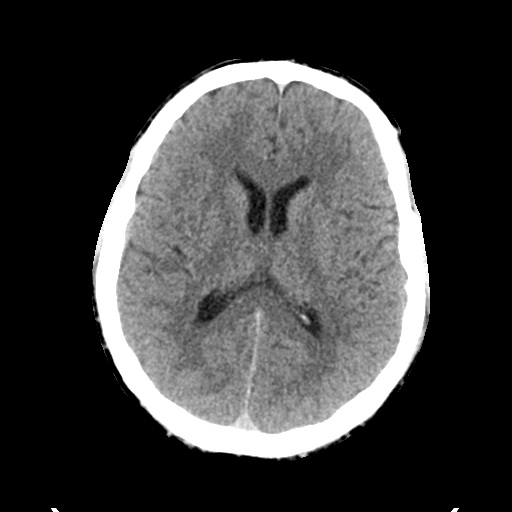
[im 23/37  brain]
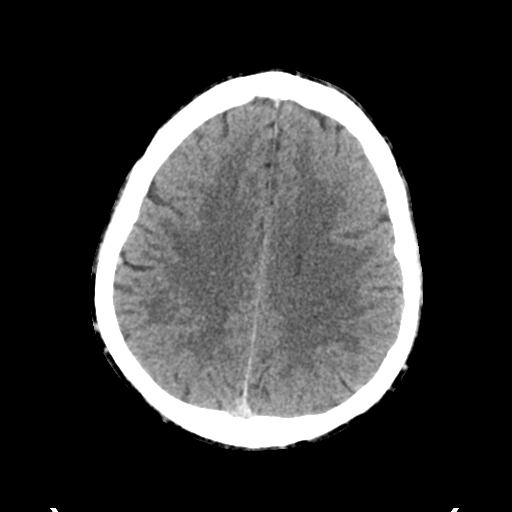
[im 23/37  bone]
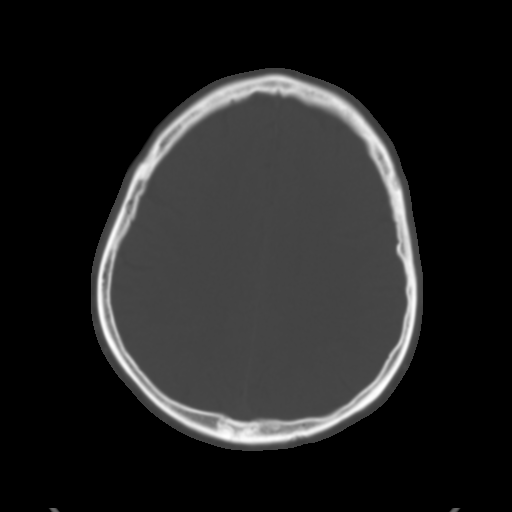
[im 28/37  brain]
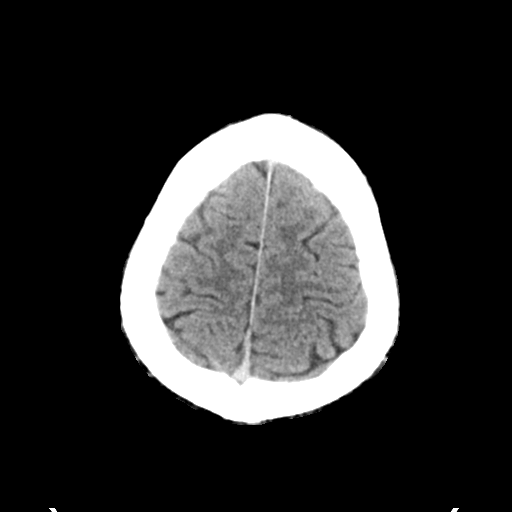
[im 32/37  brain]
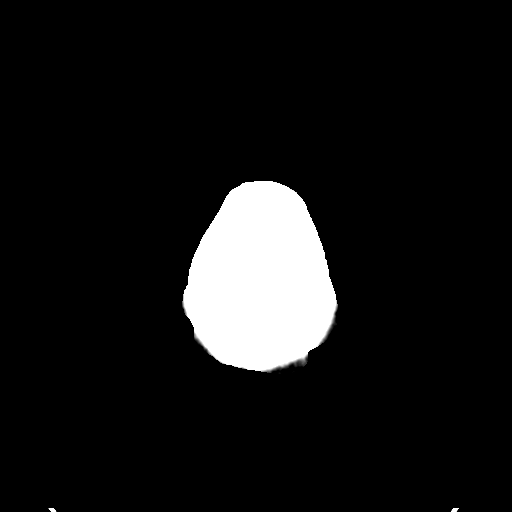

[Series 4: head bone · axial · 0.46mm/px · z∈[-76,-40]mm · 3 of 91 slices shown]
[im 10/91  bone]
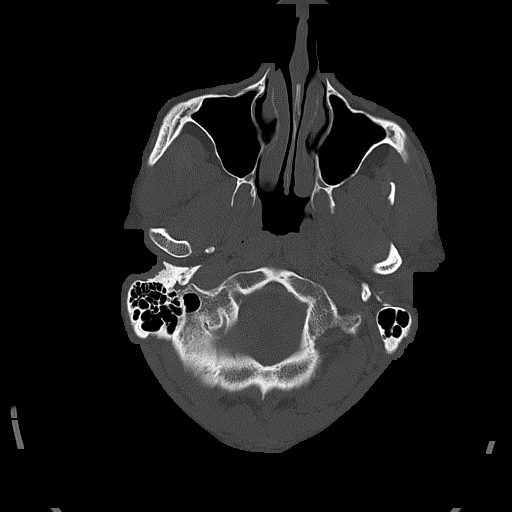
[im 19/91  bone]
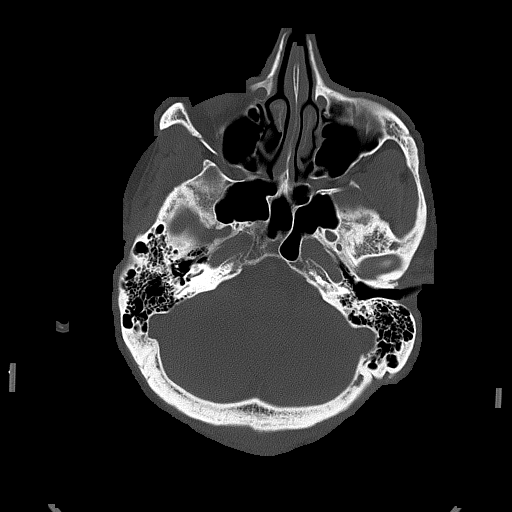
[im 28/91  bone]
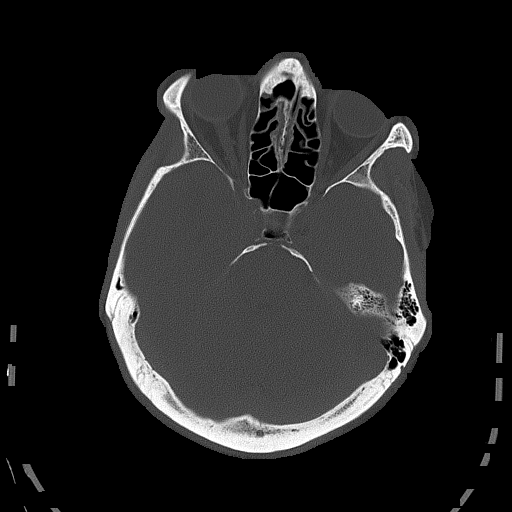

[Series 5: head without cor · coronal · non-contrast · 0.35mm/px · 3 of 72 slices shown]
[im 24/72  brain]
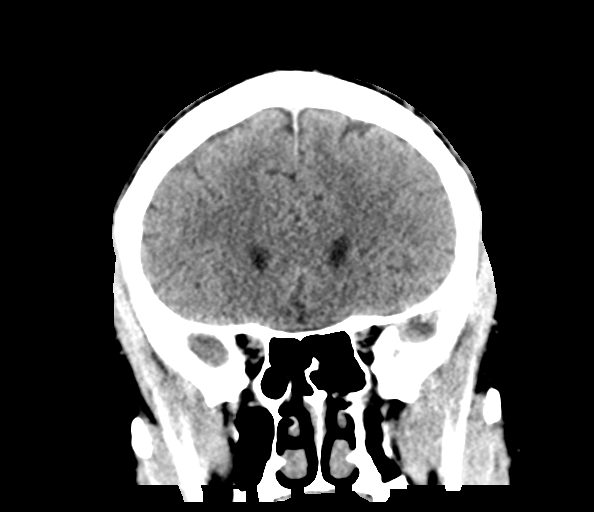
[im 32/72  brain]
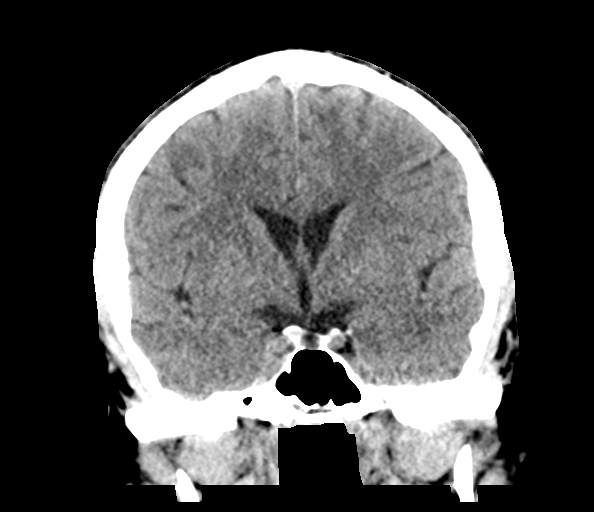
[im 40/72  brain]
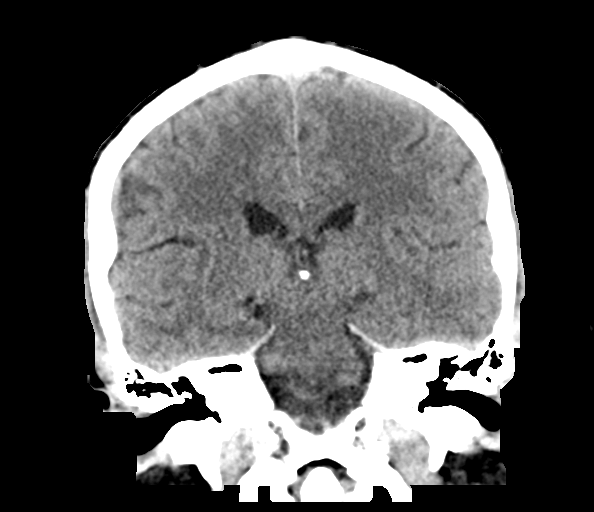

[Series 6: head without sag · sagittal · non-contrast · 0.35mm/px · 3 of 67 slices shown]
[im 23/67  brain]
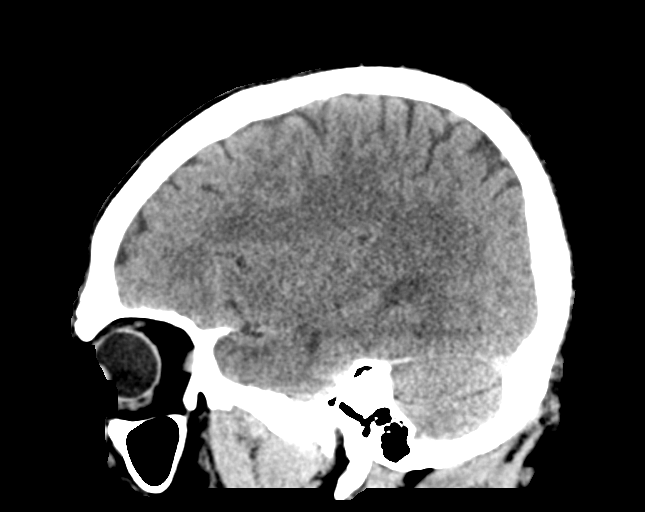
[im 34/67  brain]
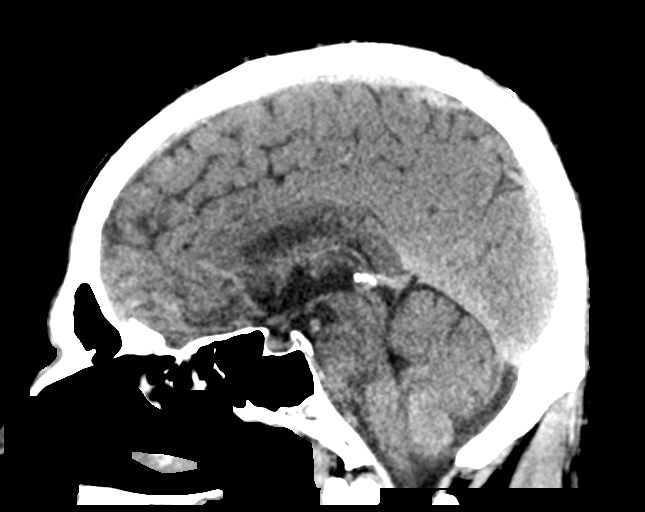
[im 45/67  brain]
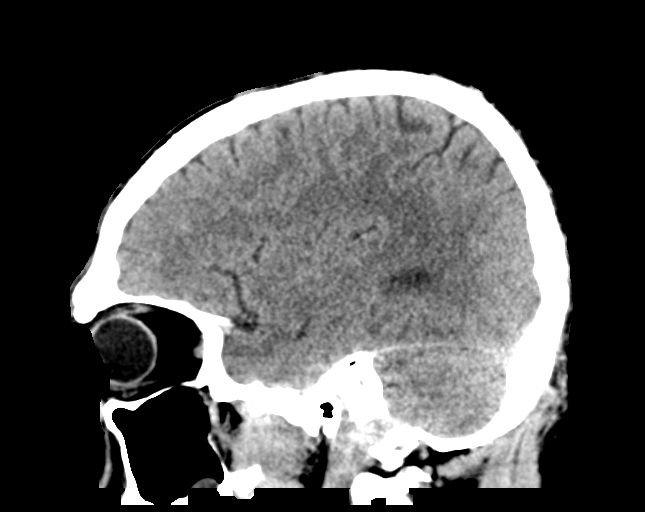

[16 of 47 positions shown; findings below may reference images not displayed]

FINDINGS: Brain: There is no evidence of acute intracranial hemorrhage,
intracranial mass, midline shift or extra-axial fluid collection.No
demarcated cortical infarction. Minimal ill-defined hypoattenuation
within the cerebral white matter is nonspecific, but consistent with
chronic small vessel ischemic disease.

Vascular: No hyperdense vessel.

Skull: Normal. Negative for fracture or focal lesion.

Sinuses/Orbits: Visualized orbits demonstrate no acute abnormality.
Small left maxillary sinus mucous retention cyst. Mild ethmoid,
sphenoid and right maxillary sinus mucosal thickening. No
significant mastoid effusion.
IMPRESSION: No evidence of acute intracranial abnormality.

Minimal chronic small vessel ischemic changes within the cerebral
white matter.

Mild paranasal sinus mucosal thickening. Left maxillary sinus mucous
retention cyst.

## 2023-06-08 ENCOUNTER — Encounter (HOSPITAL_COMMUNITY): Payer: Self-pay | Admitting: *Deleted

## 2023-06-08 ENCOUNTER — Ambulatory Visit (HOSPITAL_COMMUNITY)
Admission: EM | Admit: 2023-06-08 | Discharge: 2023-06-08 | Disposition: A | Discharge status: Home / Self Care | Payer: Self-pay | Attending: Family Medicine | Admitting: Family Medicine

## 2023-06-08 DIAGNOSIS — I10 Essential (primary) hypertension: Secondary | ICD-10-CM | POA: Insufficient documentation

## 2023-06-08 LAB — COMPREHENSIVE METABOLIC PANEL
ALT: 16 U/L (ref 0–44)
AST: 19 U/L (ref 15–41)
Albumin: 3.9 g/dL (ref 3.5–5.0)
Alkaline Phosphatase: 48 U/L (ref 38–126)
Anion gap: 8 (ref 5–15)
BUN: 16 mg/dL (ref 8–23)
CO2: 28 mmol/L (ref 22–32)
Calcium: 9.1 mg/dL (ref 8.9–10.3)
Chloride: 102 mmol/L (ref 98–111)
Creatinine, Ser: 1.14 mg/dL (ref 0.61–1.24)
GFR, Estimated: >60 mL/min (ref >60)
Glucose, Bld: 95 mg/dL (ref 70–99)
Potassium: 3.6 mmol/L (ref 3.5–5.1)
Sodium: 138 mmol/L (ref 135–145)
Total Bilirubin: 0.5 mg/dL (ref 0.0–1.2)
Total Protein: 7 g/dL (ref 6.5–8.1)

## 2023-06-08 MED ORDER — LOSARTAN POTASSIUM 50 MG PO TABS: 50.0000 mg | ORAL_TABLET | Freq: Every day | ORAL | 0 refills | Status: AC

## 2023-06-08 NOTE — ED Provider Notes (Signed)
 [facility]    CSN: [id number] Arrival date & time: 06/08/23  1444      History   Chief Complaint Chief Complaint  Patient presents with   Medication Refill    HPI David Maddox is a 62 y.o. male.   Here with complaint of hypertension.  He has not taken any blood pressure medicine for 6 months.  He was on amlodipine  and did not like how he felt so he stopped it.  He works full-time and is not able to get off to go to the doctor.  He does not get paid if he does not work.  He does have an appointment with the [facility] primary [facility] on August 17, 2023.  He is requesting an ARB.  He spoke with a family member who thought that would be the best kind of medicine for him.   Medication Refill   Past Medical History:  Diagnosis Date   Essential hypertension    Hyperlipidemia     Patient Active Problem List   Diagnosis Date Noted   Hypertensive emergency 09/22/2019   Episode of visual loss of right eye 09/22/2019   Paranoid delusion (HCC) 09/22/2019   Mixed hyperlipidemia 06/11/2016   Essential hypertension 08/14/2015    History reviewed. No pertinent surgical history.     Home Medications    Prior to Admission medications   Medication Sig Start Date End Date Taking? Authorizing Provider  losartan  (COZAAR ) 50 MG tablet Take 1 tablet (50 mg total) by mouth daily. 06/08/23 09/06/23 Yes Ival Domino, FNP    Family History Family History  Problem Relation Age of Onset   Hyperlipidemia Mother    Diabetes Mother    Hypertension Mother    Stroke Mother    Hypertension Father    Hyperlipidemia Father    Stroke Father    Diabetes Brother    Diabetes Maternal Grandmother    Diabetes Maternal Grandfather     Social History Social History   Tobacco Use   Smoking status: Never   Smokeless tobacco: Never  Vaping Use   Vaping status: Never Used  Substance Use Topics   Alcohol use: Not Currently   Drug use: No     Allergies   Patient has no  known allergies.   Review of Systems Review of Systems  Constitutional:  Negative for chills and fever.  HENT:  Negative for ear pain and sore throat.   Eyes:  Negative for pain and visual disturbance.  Respiratory:  Negative for cough and shortness of breath.   Cardiovascular:  Negative for chest pain and palpitations.  Gastrointestinal:  Negative for abdominal pain and vomiting.  Genitourinary:  Negative for dysuria and hematuria.  Musculoskeletal:  Negative for arthralgias and back pain.  Skin:  Negative for color change and rash.  Neurological:  Negative for seizures and syncope.  All other systems reviewed and are negative.    Physical Exam Triage Vital Signs ED Triage Vitals  Encounter Vitals Group     BP 06/08/23 1550 (!) 187/106     Systolic BP Percentile --      Diastolic BP Percentile --      Pulse Rate 06/08/23 1550 69     Resp 06/08/23 1550 18     Temp 06/08/23 1550 98 F (36.7 C)     Temp Source 06/08/23 1550 Oral     SpO2 06/08/23 1550 96 %     Weight --      Height --  Head Circumference --      Peak Flow --      Pain Score 06/08/23 1547 0     Pain Loc --      Pain Education --      Exclude from Growth Chart --    No data found.  Updated Vital Signs BP (!) 187/106 (BP Location: Left Arm)   Pulse 69   Temp 98 F (36.7 C) (Oral)   Resp 18   SpO2 96%   Visual Acuity Right Eye Distance:   Left Eye Distance:   Bilateral Distance:    Right Eye Near:   Left Eye Near:    Bilateral Near:     Physical Exam Vitals and nursing note reviewed.  Constitutional:      General: He is not in acute distress.    Appearance: He is well-developed.  HENT:     Head: Normocephalic and atraumatic.     Right Ear: Hearing, tympanic membrane, ear canal and external ear normal.     Left Ear: Hearing, tympanic membrane, ear canal and external ear normal.     Nose: Nose normal. No congestion or rhinorrhea.     Right Sinus: No maxillary sinus tenderness or  frontal sinus tenderness.     Left Sinus: No maxillary sinus tenderness or frontal sinus tenderness.     Mouth/Throat:     Lips: Pink.     Mouth: Mucous membranes are moist.     Pharynx: Uvula midline. No oropharyngeal exudate or posterior oropharyngeal erythema.  Eyes:     Conjunctiva/sclera: Conjunctivae normal.     Pupils: Pupils are equal, round, and reactive to light.  Cardiovascular:     Rate and Rhythm: Normal rate and regular rhythm.     Heart sounds: Normal heart sounds, S1 normal and S2 normal. No murmur heard. Pulmonary:     Effort: Pulmonary effort is normal. No respiratory distress.     Breath sounds: Normal breath sounds.  Abdominal:     Palpations: Abdomen is soft.     Tenderness: There is no abdominal tenderness.  Musculoskeletal:        General: No swelling.     Cervical back: Neck supple.  Lymphadenopathy:     Cervical: No cervical adenopathy.  Skin:    General: Skin is warm and dry.     Capillary Refill: Capillary refill takes less than 2 seconds.     Findings: No rash.  Neurological:     Mental Status: He is alert and oriented to person, place, and time.  Psychiatric:        Mood and Affect: Mood normal.      UC Treatments / Results  Labs (all labs ordered are listed, but only abnormal results are displayed) Labs Reviewed  COMPREHENSIVE METABOLIC PANEL    EKG   Radiology No results found.  Procedures Procedures (including critical care time)  Medications Ordered in UC Medications - No data to display  Initial Impression / Assessment and Plan / UC Course  I have reviewed the triage vital signs and the nursing notes.  Pertinent labs & imaging results that were available during my care of the patient were reviewed by me and considered in my medical decision making (see chart for details).  Malignant hypertension: The patient has a blood pressure cuff at home and agrees that he will monitor his blood pressure.  Provided educational handouts  on monitoring his blood pressure, on hypertension and on managing hypertension.  Reviewed the risks benefits and  alternatives of using losartan .  Losartan  50 mg, #1, once daily for hypertension.  Provided an educational handout on use of losartan .  Mistry panel is pending to check renal function.  The patient works full-time and does not get paid if he does not work he has an appointment on 08/17/2023 primary care.  That was the first appointment he could get.  Will provide his losartan  for 90 days to cover him until he can be seen by primary care.  Follow-up here if any problems improving the blood pressure with the use of the losartan , if any headaches, or any other symptoms.  Final Clinical Impressions(s) / UC Diagnoses   Final diagnoses:  Malignant hypertension     Discharge Instructions      Blood pressure is 187/106.  Will start on losartan , 50 mg, #1 daily, for hypertension.  Provided education on use of losartan .  Provided education on hypertension and managing blood pressure.  Chemistry panel is pending to check on his kidney function.  Encouraged to monitor his blood pressure and return if his blood pressure does not improve after starting the losartan .  Return here if any symptoms such as headaches, dizziness or any other symptoms that he believes are related to his blood pressure.  Follow-up with primary care on 08/17/2023 as planned.  Return here if blood pressures are not controlled.     ED Prescriptions     Medication Sig Dispense Auth. Provider   losartan  (COZAAR ) 50 MG tablet Take 1 tablet (50 mg total) by mouth daily. 90 tablet Ival Domino, FNP      PDMP not reviewed this encounter.   Ival Domino, FNP 06/08/23 1616

## 2023-06-08 NOTE — ED Triage Notes (Signed)
 Pt states he has been without his BP meds over 6 months. He states he called his PCP the last two days and can't get in with them. He hasn't seen then in 1.5 year. He states he has a new PCP appt 3/11 with Dr Tanda. He came in today since he is off of work.   He states he does not want his same blood pressure meds he wasnts a blocker med.

## 2023-06-08 NOTE — Discharge Instructions (Addendum)
 Blood pressure is 187/106.  Will start on losartan , 50 mg, #1 daily, for hypertension.  Provided education on use of losartan .  Provided education on hypertension and managing blood pressure.  Chemistry panel is pending to check on his kidney function.  Encouraged to monitor his blood pressure and return if his blood pressure does not improve after starting the losartan .  Return here if any symptoms such as headaches, dizziness or any other symptoms that he believes are related to his blood pressure.  Follow-up with primary care on 08/17/2023 as planned.  Return here if blood pressures are not controlled.

## 2023-06-10 ENCOUNTER — Telehealth: Payer: Self-pay

## 2023-06-10 NOTE — Telephone Encounter (Signed)
Sent to patient via mail. °

## 2023-06-10 NOTE — Telephone Encounter (Signed)
 Copied from CRM [id number]. Topic: General - Billing Inquiry >> Jun 08, 2023  2:10 PM David Maddox wrote: Reason for CRM: Pt is scheduled for new patient appt on 08/17/2022. Pt will be self pay and needing self pay estimate.   Please follow up with patient

## 2023-08-17 ENCOUNTER — Ambulatory Visit: Payer: Self-pay | Admitting: Family Medicine

## 2023-10-26 ENCOUNTER — Ambulatory Visit
Admission: RE | Admit: 2023-10-26 | Discharge: 2023-10-26 | Disposition: A | Discharge status: Home / Self Care | Payer: Worker's Compensation | Source: Ambulatory Visit | Attending: Family Medicine | Admitting: Family Medicine

## 2023-10-26 ENCOUNTER — Other Ambulatory Visit: Payer: Self-pay | Admitting: Family Medicine

## 2023-10-26 DIAGNOSIS — S6992XA Unspecified injury of left wrist, hand and finger(s), initial encounter: Secondary | ICD-10-CM
